# Patient Record
Sex: Female | Born: 1955 | Race: White | Hispanic: No | Marital: Married | Smoking: Never smoker
Health system: Southern US, Community
[De-identification: ages and names within clinical notes are randomized; demographics above are authoritative.]

## PROBLEM LIST (undated history)

## (undated) DIAGNOSIS — F32A Depression, unspecified: Secondary | ICD-10-CM

## (undated) DIAGNOSIS — R45851 Suicidal ideations: Secondary | ICD-10-CM

## (undated) DIAGNOSIS — G4700 Insomnia, unspecified: Secondary | ICD-10-CM

## (undated) DIAGNOSIS — F329 Major depressive disorder, single episode, unspecified: Secondary | ICD-10-CM

## (undated) DIAGNOSIS — F439 Reaction to severe stress, unspecified: Secondary | ICD-10-CM

## (undated) DIAGNOSIS — F39 Unspecified mood [affective] disorder: Secondary | ICD-10-CM

## (undated) HISTORY — DX: Reaction to severe stress, unspecified: F43.9

## (undated) HISTORY — DX: Major depressive disorder, single episode, unspecified: F32.9

## (undated) HISTORY — PX: ABDOMINAL HYSTERECTOMY: SHX81

## (undated) HISTORY — DX: Insomnia, unspecified: G47.00

## (undated) HISTORY — DX: Unspecified mood (affective) disorder: F39

## (undated) HISTORY — DX: Suicidal ideations: R45.851

## (undated) HISTORY — DX: Depression, unspecified: F32.A

---

## 1999-10-21 ENCOUNTER — Emergency Department (HOSPITAL_COMMUNITY): Admission: EM | Admit: 1999-10-21 | Discharge: 1999-10-21 | Payer: Self-pay | Admitting: Emergency Medicine

## 1999-10-21 ENCOUNTER — Encounter: Payer: Self-pay | Admitting: Emergency Medicine

## 1999-10-23 ENCOUNTER — Encounter: Payer: Self-pay | Admitting: General Surgery

## 1999-10-23 ENCOUNTER — Encounter (INDEPENDENT_AMBULATORY_CARE_PROVIDER_SITE_OTHER): Payer: Self-pay | Admitting: Specialist

## 1999-10-23 ENCOUNTER — Observation Stay (HOSPITAL_COMMUNITY): Admission: RE | Admit: 1999-10-23 | Discharge: 1999-10-24 | Payer: Self-pay | Admitting: General Surgery

## 2003-08-19 ENCOUNTER — Other Ambulatory Visit: Admission: RE | Admit: 2003-08-19 | Discharge: 2003-08-19 | Payer: Self-pay | Admitting: Internal Medicine

## 2005-01-07 ENCOUNTER — Ambulatory Visit: Payer: Self-pay | Admitting: Internal Medicine

## 2007-07-17 ENCOUNTER — Telehealth (INDEPENDENT_AMBULATORY_CARE_PROVIDER_SITE_OTHER): Payer: Self-pay | Admitting: *Deleted

## 2007-09-20 DIAGNOSIS — F329 Major depressive disorder, single episode, unspecified: Secondary | ICD-10-CM | POA: Insufficient documentation

## 2007-09-21 ENCOUNTER — Ambulatory Visit: Payer: Self-pay | Admitting: Internal Medicine

## 2008-05-15 ENCOUNTER — Telehealth: Payer: Self-pay | Admitting: Internal Medicine

## 2008-07-17 ENCOUNTER — Telehealth: Payer: Self-pay | Admitting: Internal Medicine

## 2008-09-04 ENCOUNTER — Telehealth: Payer: Self-pay | Admitting: Internal Medicine

## 2008-09-12 ENCOUNTER — Ambulatory Visit: Payer: Self-pay | Admitting: Internal Medicine

## 2008-09-12 DIAGNOSIS — E669 Obesity, unspecified: Secondary | ICD-10-CM

## 2008-09-12 LAB — CONVERTED CEMR LAB
Bilirubin Urine: NEGATIVE
Blood in Urine, dipstick: NEGATIVE
Glucose, Urine, Semiquant: NEGATIVE
Nitrite: NEGATIVE
Protein, U semiquant: NEGATIVE
Specific Gravity, Urine: 1.02
Urobilinogen, UA: 0.2
WBC Urine, dipstick: NEGATIVE
pH: 5.5

## 2008-09-13 LAB — CONVERTED CEMR LAB
ALT: 14 units/L (ref 0–35)
AST: 23 units/L (ref 0–37)
Albumin: 4.2 g/dL (ref 3.5–5.2)
Alkaline Phosphatase: 62 units/L (ref 39–117)
BUN: 17 mg/dL (ref 6–23)
Basophils Absolute: 0 10*3/uL (ref 0.0–0.1)
Basophils Relative: 0.4 % (ref 0.0–3.0)
Bilirubin, Direct: 0.1 mg/dL (ref 0.0–0.3)
CO2: 27 meq/L (ref 19–32)
Calcium: 9.9 mg/dL (ref 8.4–10.5)
Chloride: 106 meq/L (ref 96–112)
Cholesterol: 162 mg/dL (ref 0–200)
Creatinine, Ser: 1 mg/dL (ref 0.4–1.2)
Eosinophils Absolute: 0.2 10*3/uL (ref 0.0–0.7)
Eosinophils Relative: 2.6 % (ref 0.0–5.0)
GFR calc Af Amer: 75 mL/min
GFR calc non Af Amer: 62 mL/min
Glucose, Bld: 94 mg/dL (ref 70–99)
HCT: 42.7 % (ref 36.0–46.0)
HDL: 33.9 mg/dL — ABNORMAL LOW (ref >39.0)
Hemoglobin: 14.6 g/dL (ref 12.0–15.0)
LDL Cholesterol: 107 mg/dL — ABNORMAL HIGH (ref 0–99)
Lymphocytes Relative: 34.5 % (ref 12.0–46.0)
MCHC: 34.3 g/dL (ref 30.0–36.0)
MCV: 95.2 fL (ref 78.0–100.0)
Monocytes Absolute: 0.4 10*3/uL (ref 0.1–1.0)
Monocytes Relative: 6.8 % (ref 3.0–12.0)
Neutro Abs: 3.7 10*3/uL (ref 1.4–7.7)
Neutrophils Relative %: 55.7 % (ref 43.0–77.0)
Platelets: 195 10*3/uL (ref 150–400)
Potassium: 4.7 meq/L (ref 3.5–5.1)
RBC: 4.48 M/uL (ref 3.87–5.11)
RDW: 12.2 % (ref 11.5–14.6)
Sodium: 143 meq/L (ref 135–145)
TSH: 1.14 microintl units/mL (ref 0.35–5.50)
Total Bilirubin: 0.7 mg/dL (ref 0.3–1.2)
Total CHOL/HDL Ratio: 4.8
Total Protein: 7.2 g/dL (ref 6.0–8.3)
Triglycerides: 105 mg/dL (ref 0–149)
VLDL: 21 mg/dL (ref 0–40)
WBC: 6.5 10*3/uL (ref 4.5–10.5)

## 2008-10-17 ENCOUNTER — Telehealth (INDEPENDENT_AMBULATORY_CARE_PROVIDER_SITE_OTHER): Payer: Self-pay | Admitting: *Deleted

## 2008-10-18 ENCOUNTER — Ambulatory Visit: Payer: Self-pay | Admitting: Internal Medicine

## 2008-11-13 ENCOUNTER — Telehealth: Payer: Self-pay | Admitting: Internal Medicine

## 2009-05-27 LAB — CONVERTED CEMR LAB: Pap Smear: NORMAL

## 2009-09-29 ENCOUNTER — Ambulatory Visit: Payer: Self-pay | Admitting: Internal Medicine

## 2010-05-04 ENCOUNTER — Telehealth: Payer: Self-pay | Admitting: Internal Medicine

## 2010-07-01 ENCOUNTER — Ambulatory Visit: Payer: Self-pay | Admitting: Internal Medicine

## 2010-07-01 DIAGNOSIS — M549 Dorsalgia, unspecified: Secondary | ICD-10-CM | POA: Insufficient documentation

## 2010-07-02 LAB — CONVERTED CEMR LAB
ALT: 17 units/L (ref 0–35)
AST: 23 units/L (ref 0–37)
Albumin: 4.1 g/dL (ref 3.5–5.2)
Alkaline Phosphatase: 65 units/L (ref 39–117)
BUN: 16 mg/dL (ref 6–23)
Basophils Absolute: 0 10*3/uL (ref 0.0–0.1)
Basophils Relative: 0.4 % (ref 0.0–3.0)
Bilirubin, Direct: 0.2 mg/dL (ref 0.0–0.3)
CO2: 29 meq/L (ref 19–32)
Calcium: 9.4 mg/dL (ref 8.4–10.5)
Chloride: 110 meq/L (ref 96–112)
Cholesterol: 223 mg/dL — ABNORMAL HIGH (ref 0–200)
Creatinine, Ser: 0.9 mg/dL (ref 0.4–1.2)
Direct LDL: 123 mg/dL
Eosinophils Absolute: 0.2 10*3/uL (ref 0.0–0.7)
Eosinophils Relative: 3.5 % (ref 0.0–5.0)
GFR calc non Af Amer: 73.6 mL/min (ref >60)
Glucose, Bld: 101 mg/dL — ABNORMAL HIGH (ref 70–99)
HCT: 44.4 % (ref 36.0–46.0)
HDL: 50.1 mg/dL (ref >39.00)
Hemoglobin: 15.1 g/dL — ABNORMAL HIGH (ref 12.0–15.0)
Lymphocytes Relative: 31 % (ref 12.0–46.0)
Lymphs Abs: 2.1 10*3/uL (ref 0.7–4.0)
MCHC: 34 g/dL (ref 30.0–36.0)
MCV: 94.4 fL (ref 78.0–100.0)
Monocytes Absolute: 0.4 10*3/uL (ref 0.1–1.0)
Monocytes Relative: 5.2 % (ref 3.0–12.0)
Neutro Abs: 4.1 10*3/uL (ref 1.4–7.7)
Neutrophils Relative %: 59.9 % (ref 43.0–77.0)
Platelets: 173 10*3/uL (ref 150.0–400.0)
Potassium: 5.2 meq/L — ABNORMAL HIGH (ref 3.5–5.1)
RBC: 4.71 M/uL (ref 3.87–5.11)
RDW: 13.4 % (ref 11.5–14.6)
Sodium: 145 meq/L (ref 135–145)
TSH: 1.48 microintl units/mL (ref 0.35–5.50)
Total Bilirubin: 0.6 mg/dL (ref 0.3–1.2)
Total CHOL/HDL Ratio: 4
Total Protein: 7.1 g/dL (ref 6.0–8.3)
Triglycerides: 239 mg/dL — ABNORMAL HIGH (ref 0.0–149.0)
VLDL: 47.8 mg/dL — ABNORMAL HIGH (ref 0.0–40.0)
WBC: 6.8 10*3/uL (ref 4.5–10.5)

## 2010-10-27 ENCOUNTER — Ambulatory Visit: Payer: Self-pay | Admitting: Internal Medicine

## 2010-10-27 LAB — CONVERTED CEMR LAB
Bilirubin Urine: NEGATIVE
Glucose, Urine, Semiquant: NEGATIVE
Ketones, urine, test strip: NEGATIVE
Nitrite: NEGATIVE
Specific Gravity, Urine: 1.02
Urobilinogen, UA: 0.2
pH: 5.5

## 2011-01-16 ENCOUNTER — Encounter: Payer: Self-pay | Admitting: Internal Medicine

## 2011-01-26 NOTE — Assessment & Plan Note (Signed)
Summary: REVIEW MEDS, BACK ISSUES/ET   Vital Signs:  Patient profile:   55 year old female Height:      64 inches Weight:      218 pounds BMI:     37.55 Temp:     98.3 degrees F oral Pulse rate:   64 / minute Pulse rhythm:   regular Resp:     14 per minute BP sitting:   106 / 76  (left arm) Cuff size:   regular  Vitals Entered By: Gladis Riffle, RN (July 01, 2010 10:13 AM) CC: med review, also pulled back 06/29/10 working in yard, getting worse with spasms Is Patient Diabetic? No   CC:  med review, also pulled back 06/29/10 working in yard, and getting worse with spasms.  History of Present Illness: back pain she relates to working in yard duration: 2 days (admits to pain 3 weeks ago---acute onset bending over in shower) 2 days ago had gradual onset of back pain over 1-2 hours. became severe.  this a.m. pain was severe (wanted to go to hospital). Pain has eased off some no radiation of pain  All other systems reviewed and were negative   Preventive Screening-Counseling & Management  Alcohol-Tobacco     Smoking Status: never  Current Problems (verified): 1)  Obesity  (ICD-278.00) 2)  Preventive Health Care  (ICD-V70.0) 3)  Depression  (ICD-311)  Current Medications (verified): 1)  Alprazolam 0.5 Mg Tabs (Alprazolam) .... 3/4 Tablet By Mouth Every Night 2)  Prozac 20 Mg  Caps (Fluoxetine Hcl) .... One By Mouth Daily  Allergies: 1)  ! Pcn  Past History:  Past Medical History: Last updated: 10/18/2008 Depression Suicidal Thoughts (No Plans) Stress Insomnia Mood D/O   Past Surgical History: Last updated: 09/20/2007 Hysterectomy  Family History: Last updated: 09/29/2009 Family History of SLE Family History of Bowel disease Family History Kidney disease Family History of Prostate CA 1st degree relative <50 Family History of Respiratory disease Family History Lung cancer--father 37 yo  Social History: Last updated: 10/18/2008 Married Never Smoked Alcohol  use-no    Risk Factors: Smoking Status: never (07/01/2010)  Physical Exam  General:  alert and well-developed.   Head:  normocephalic and atraumatic.   Msk:  no back pain to palpation  Neurologic:  DTRs at knee and ankles normal and symmetric.    Impression & Recommendations:  Problem # 1:  BACK PAIN (ICD-724.5)  suspect muscle spasm trial muscle relaxer NSAID and as needed narcotic back exercises  side effects discussed  Her updated medication list for this problem includes:    Cyclobenzaprine Hcl 10 Mg Tabs (Cyclobenzaprine hcl) .Marland Kitchen... 1 by mouth 2 times daily as needed for back pain    Meloxicam 15 Mg Tabs (Meloxicam) ..... One by mouth daily with food    Hydrocodone-acetaminophen 5-325 Mg Tabs (Hydrocodone-acetaminophen) .Marland Kitchen... 1 by mouth up to 4 times per day as needed for pain  Complete Medication List: 1)  Alprazolam 0.5 Mg Tabs (Alprazolam) .... 3/4 tablet by mouth every night 2)  Prozac 20 Mg Caps (Fluoxetine hcl) .... One by mouth daily 3)  Cyclobenzaprine Hcl 10 Mg Tabs (Cyclobenzaprine hcl) .Marland Kitchen.. 1 by mouth 2 times daily as needed for back pain 4)  Meloxicam 15 Mg Tabs (Meloxicam) .... One by mouth daily with food 5)  Hydrocodone-acetaminophen 5-325 Mg Tabs (Hydrocodone-acetaminophen) .Marland Kitchen.. 1 by mouth up to 4 times per day as needed for pain  Other Orders: Venipuncture (66440) TLB-Lipid Panel (80061-LIPID) TLB-BMP (Basic Metabolic Panel-BMET) (80048-METABOL)  TLB-CBC Platelet - w/Differential (85025-CBCD) TLB-Hepatic/Liver Function Pnl (80076-HEPATIC) TLB-TSH (Thyroid Stimulating Hormone) (84443-TSH)  Patient Instructions: 1)  . Prescriptions: HYDROCODONE-ACETAMINOPHEN 5-325 MG TABS (HYDROCODONE-ACETAMINOPHEN) 1 by mouth up to 4 times per day as needed for pain  #30 x 0   Entered and Authorized by:   Birdie Sons MD   Signed by:   Birdie Sons MD on 07/01/2010   Method used:   Print then Give to Patient   RxID:   [id number] MELOXICAM 15 MG TABS  (MELOXICAM) one by mouth daily with food  #30 x 0   Entered and Authorized by:   Birdie Sons MD   Signed by:   Birdie Sons MD on 07/01/2010   Method used:   Electronically to        [facility] # [phone]* (retail)       [address] [place], [place]  [address]       Ph: [phone]       Fax: [phone]   RxID:   [id number] CYCLOBENZAPRINE HCL 10 MG  TABS (CYCLOBENZAPRINE HCL) 1 by mouth 2 times daily as needed for back pain  #20 x 1   Entered and Authorized by:   Birdie Sons MD   Signed by:   Birdie Sons MD on 07/01/2010   Method used:   Electronically to        [facility] [address] # [phone]* (retail)       [address] [place], [place]  [address]       Ph: [phone]       Fax: [phone]   RxID:   [id number]   Appended Document: REVIEW MEDS, BACK ISSUES/ET  Laboratory Results   Blood Tests     SED rate: 10 mm/hr  Comments: Rita Ohara  July 01, 2010 11:35 AM

## 2011-01-26 NOTE — Progress Notes (Signed)
Summary: request alprazolam  Phone Note Call from Patient Call back at [phone]   Caller: Patient--vm Call For: Birdie Sons MD Summary of Call: Lives parttime in [place] and parttime in [place].  Now in [place] and needs refill alprazolam #30 x 4 until can be seen here again in Sept.  [address] [place] [phone]. Initial call taken by: Gladis Riffle, RN,  May 04, 2010 2:41 PM  Follow-up for Phone Call        Husband is still In [facility] and has insurance here so unable to have MD in [place].  Will be back in Sept and make rov with you. Follow-up by: Gladis Riffle, RN,  May 04, 2010 2:42 PM  Additional Follow-up for Phone Call Additional follow up Details #1::        okay to refill Additional Follow-up by: Birdie Sons MD,  May 05, 2010 8:04 AM    Additional Follow-up for Phone Call Additional follow up Details #2::    see Rx.Patient notified. She is to make appt in sept. Follow-up by: Gladis Riffle, RN,  May 05, 2010 12:27 PM  Prescriptions: ALPRAZOLAM 0.5 MG TABS (ALPRAZOLAM) 3/4 tablet by mouth every night  #30 x 4   Entered by:   Gladis Riffle, RN   Authorized by:   Birdie Sons MD   Signed by:   Gladis Riffle, RN on 05/05/2010   Method used:   Telephoned to ...       [facility] [address] # [phone]* (retail)       [address] [place], [place]  [address]       Ph: [phone]       Fax: [phone]   RxID:   [id number]  called to [facility] [address] in [place], [place]  at [phone]

## 2011-03-16 ENCOUNTER — Other Ambulatory Visit: Payer: Self-pay | Admitting: Internal Medicine

## 2011-03-22 ENCOUNTER — Telehealth: Payer: Self-pay | Admitting: *Deleted

## 2011-03-22 MED ORDER — ALPRAZOLAM 0.5 MG PO TABS: 0.5000 mg | ORAL_TABLET | Freq: Every evening | ORAL | Status: DC | PRN

## 2011-03-22 NOTE — Telephone Encounter (Signed)
Pt made appt for fasting labs and OV, called in 30 day supply of xanax to [facility] in [place]

## 2011-04-19 ENCOUNTER — Other Ambulatory Visit: Payer: Self-pay | Admitting: *Deleted

## 2011-04-19 ENCOUNTER — Encounter: Payer: Self-pay | Admitting: Internal Medicine

## 2011-04-20 ENCOUNTER — Ambulatory Visit (INDEPENDENT_AMBULATORY_CARE_PROVIDER_SITE_OTHER)
Admission: RE | Admit: 2011-04-20 | Discharge: 2011-04-20 | Disposition: A | Discharge status: Home / Self Care | Payer: BC Managed Care – PPO | Source: Ambulatory Visit | Attending: Internal Medicine | Admitting: Internal Medicine

## 2011-04-20 ENCOUNTER — Ambulatory Visit (INDEPENDENT_AMBULATORY_CARE_PROVIDER_SITE_OTHER): Payer: BC Managed Care – PPO | Admitting: Internal Medicine

## 2011-04-20 ENCOUNTER — Encounter: Payer: Self-pay | Admitting: Internal Medicine

## 2011-04-20 VITALS — BP 112/84 | HR 76 | Temp 98.7°F | Ht 64.0 in | Wt 228.0 lb

## 2011-04-20 DIAGNOSIS — F329 Major depressive disorder, single episode, unspecified: Secondary | ICD-10-CM

## 2011-04-20 DIAGNOSIS — M7660 Achilles tendinitis, unspecified leg: Secondary | ICD-10-CM

## 2011-04-20 DIAGNOSIS — Z0000 Encounter for general adult medical examination without abnormal findings: Secondary | ICD-10-CM

## 2011-04-20 LAB — CBC WITH DIFFERENTIAL/PLATELET
Basophils Relative: 0.4 % (ref 0.0–3.0)
Eosinophils Relative: 6.9 % — ABNORMAL HIGH (ref 0.0–5.0)
HCT: 42.3 % (ref 36.0–46.0)
Hemoglobin: 14.4 g/dL (ref 12.0–15.0)
Lymphocytes Relative: 33.5 % (ref 12.0–46.0)
Monocytes Relative: 6.1 % (ref 3.0–12.0)
Neutro Abs: 3.8 10*3/uL (ref 1.4–7.7)
RBC: 4.51 Mil/uL (ref 3.87–5.11)

## 2011-04-20 LAB — LDL CHOLESTEROL, DIRECT: Direct LDL: 137.9 mg/dL

## 2011-04-20 LAB — BASIC METABOLIC PANEL
Calcium: 9.2 mg/dL (ref 8.4–10.5)
GFR: 69.6 mL/min (ref >60.00)
Potassium: 5.3 mEq/L — ABNORMAL HIGH (ref 3.5–5.1)
Sodium: 140 mEq/L (ref 135–145)

## 2011-04-20 LAB — LIPID PANEL
HDL: 49.2 mg/dL (ref >39.00)
Total CHOL/HDL Ratio: 4
VLDL: 33.8 mg/dL (ref 0.0–40.0)

## 2011-04-20 LAB — HEPATIC FUNCTION PANEL
Bilirubin, Direct: 0.1 mg/dL (ref 0.0–0.3)
Total Bilirubin: 0.7 mg/dL (ref 0.3–1.2)
Total Protein: 6.6 g/dL (ref 6.0–8.3)

## 2011-04-20 NOTE — Progress Notes (Signed)
  Subjective:    Patient ID: Shirley Allen, female    DOB: March 13, 1956, 55 y.o.   MRN: [id number]  HPI A lot of stress-she is splitting time between [place] and [place]  She has MSK complaints---right foot pain---knot on back of heel  Bilateral shoulder pain  She wants shingles vaccine---discussed  Weight---she admits to over eating. Also can't exercise  Past Medical History  Diagnosis Date  . Depression   . Suicidal thoughts   . Stress   . Insomnia   . Mood disorder    Past Surgical History  Procedure Date  . Abdominal hysterectomy     reports that she has never smoked. She does not have any smokeless tobacco history on file. She reports that she does not drink alcohol. Her drug history not on file. family history includes Aneurysm in her father; Arthritis in her brother, mother, and sister; Cancer in her father; Crohn's disease in her mother; Diabetes in her maternal grandmother, paternal grandfather, and paternal grandmother; Emphysema in her father; Irritable bowel syndrome in her father, paternal grandmother, and paternal uncle; Kidney disease in her father; and Lupus in her mother. Allergies  Allergen Reactions  . Penicillins       Review of Systems  patient denies chest pain, shortness of breath, orthopnea. Denies lower extremity edema, abdominal pain, change in appetite, change in bowel movements. Patient denies rashes, musculoskeletal complaints. No other specific complaints in a complete review of systems.      Objective:   Physical Exam  Well-developed well-nourished female in no acute distress. HEENT exam atraumatic, normocephalic, extraocular muscles are intact. Neck is supple. No jugular venous distention no thyromegaly. Chest clear to auscultation without increased work of breathing. Cardiac exam S1 and S2 are regular. Abdominal exam active bowel sounds, soft, nontender. Extremities no edema. Neurologic exam she is alert without any motor sensory deficits. Gait is  normal.        Assessment & Plan:  Well visit Discussed meds Advised regular exercise

## 2011-04-23 ENCOUNTER — Other Ambulatory Visit: Payer: Self-pay | Admitting: Internal Medicine

## 2011-04-23 DIAGNOSIS — M7660 Achilles tendinitis, unspecified leg: Secondary | ICD-10-CM

## 2011-04-25 NOTE — Assessment & Plan Note (Signed)
Discussed at length She is doing reasonably well Continue same meds

## 2011-04-26 ENCOUNTER — Telehealth: Payer: Self-pay | Admitting: *Deleted

## 2011-04-26 NOTE — Telephone Encounter (Signed)
Called pt and gave Dr Cato Mulligan recommendations on ankle xray and she called and l/m on voicemail that she was not going to do PT right now.  Wanted Dr Cato Mulligan to be aware that she changed her mind

## 2011-05-03 ENCOUNTER — Other Ambulatory Visit: Payer: Self-pay | Admitting: Internal Medicine

## 2011-06-15 ENCOUNTER — Ambulatory Visit: Payer: BC Managed Care – PPO | Admitting: Internal Medicine

## 2011-06-18 ENCOUNTER — Ambulatory Visit (INDEPENDENT_AMBULATORY_CARE_PROVIDER_SITE_OTHER): Payer: BC Managed Care – PPO | Admitting: Internal Medicine

## 2011-06-18 ENCOUNTER — Encounter: Payer: Self-pay | Admitting: Internal Medicine

## 2011-06-18 ENCOUNTER — Ambulatory Visit (INDEPENDENT_AMBULATORY_CARE_PROVIDER_SITE_OTHER)
Admission: RE | Admit: 2011-06-18 | Discharge: 2011-06-18 | Disposition: A | Discharge status: Home / Self Care | Payer: BC Managed Care – PPO | Source: Ambulatory Visit | Attending: Internal Medicine | Admitting: Internal Medicine

## 2011-06-18 VITALS — BP 116/80 | Temp 98.4°F | Ht 64.0 in | Wt 224.0 lb

## 2011-06-18 DIAGNOSIS — M719 Bursopathy, unspecified: Secondary | ICD-10-CM

## 2011-06-18 DIAGNOSIS — M7580 Other shoulder lesions, unspecified shoulder: Secondary | ICD-10-CM

## 2011-06-18 DIAGNOSIS — M67919 Unspecified disorder of synovium and tendon, unspecified shoulder: Secondary | ICD-10-CM

## 2011-06-18 NOTE — Assessment & Plan Note (Signed)
Needs to have further eval Check xray and send for PT

## 2011-06-18 NOTE — Progress Notes (Signed)
  Subjective:    Patient ID: Shirley Allen, female    DOB: May 01, 1956, 55 y.o.   MRN: [id number]  HPI l handed Progressive left arm pain for 6+ months. Now to the point that she cannot "do anything". Decreased ROM. Can't lift arm over head. She doesn't recall any recent trauma but many months ago she does recall a fall and she did catch most of her weight using her left arm. She denies any weakness. She denies any swelling of the shoulder.   Past Medical History  Diagnosis Date  . Depression   . Suicidal thoughts   . Stress   . Insomnia   . Mood disorder    Past Surgical History  Procedure Date  . Abdominal hysterectomy     reports that she has never smoked. She does not have any smokeless tobacco history on file. She reports that she does not drink alcohol. Her drug history not on file. family history includes Aneurysm in her father; Arthritis in her brother, mother, and sister; Cancer in her father; Crohn's disease in her mother; Diabetes in her maternal grandmother, paternal grandfather, and paternal grandmother; Emphysema in her father; Irritable bowel syndrome in her father, paternal grandmother, and paternal uncle; Kidney disease in her father; and Lupus in her mother. Allergies  Allergen Reactions  . Penicillins      Review of Systems    patient denies chest pain, shortness of breath, orthopnea. Denies lower extremity edema, abdominal pain, change in appetite, change in bowel movements. Patient denies rashes, musculoskeletal complaints. No other specific complaints in a complete review of systems.    Objective:   Physical Exam Well-developed, overweight female in no acute distress. Neck is supple. Chest clear to auscultation cardiac exam S1 and S2 are regular. She has full range of motion of her right shoulder. She is significantly decreased abduction of her left shoulder. She has significantly decreased range of motion with pain to internal and external rotation.        Assessment & Plan:  Patient has a rotator cuff injury. I think she should see an orthopedist. I suspect she will need surgery. She would like to pursue other avenues at this time. I will refer her to physical therapy to start with. She will call me at the end of physical therapy. I suspect she will need orthopedic intervention.

## 2011-06-22 ENCOUNTER — Telehealth: Payer: Self-pay | Admitting: *Deleted

## 2011-06-22 NOTE — Telephone Encounter (Signed)
Pt aware.

## 2011-06-22 NOTE — Telephone Encounter (Signed)
no

## 2011-06-22 NOTE — Telephone Encounter (Signed)
Pt wants to know if Dr Cato Mulligan wants an MRI before she start PT

## 2011-06-29 ENCOUNTER — Ambulatory Visit: Payer: BC Managed Care – PPO | Attending: Internal Medicine | Admitting: Physical Therapy

## 2011-06-29 DIAGNOSIS — M25519 Pain in unspecified shoulder: Secondary | ICD-10-CM | POA: Insufficient documentation

## 2011-06-29 DIAGNOSIS — M25619 Stiffness of unspecified shoulder, not elsewhere classified: Secondary | ICD-10-CM | POA: Insufficient documentation

## 2011-06-29 DIAGNOSIS — IMO0001 Reserved for inherently not codable concepts without codable children: Secondary | ICD-10-CM | POA: Insufficient documentation

## 2011-06-29 DIAGNOSIS — R5381 Other malaise: Secondary | ICD-10-CM | POA: Insufficient documentation

## 2011-07-06 ENCOUNTER — Ambulatory Visit: Payer: BC Managed Care – PPO | Admitting: Physical Therapy

## 2011-07-08 ENCOUNTER — Ambulatory Visit: Payer: BC Managed Care – PPO | Admitting: Physical Therapy

## 2011-07-13 ENCOUNTER — Ambulatory Visit: Payer: BC Managed Care – PPO | Admitting: Rehabilitation

## 2011-07-15 ENCOUNTER — Ambulatory Visit: Payer: BC Managed Care – PPO | Admitting: Rehabilitation

## 2011-07-20 ENCOUNTER — Ambulatory Visit: Payer: BC Managed Care – PPO | Admitting: Rehabilitation

## 2011-07-22 ENCOUNTER — Ambulatory Visit: Payer: BC Managed Care – PPO | Admitting: Rehabilitation

## 2011-07-27 ENCOUNTER — Ambulatory Visit: Payer: BC Managed Care – PPO | Admitting: Rehabilitation

## 2011-07-29 ENCOUNTER — Ambulatory Visit: Payer: BC Managed Care – PPO | Admitting: Rehabilitation

## 2011-08-03 ENCOUNTER — Encounter: Payer: BC Managed Care – PPO | Admitting: Rehabilitation

## 2011-08-05 ENCOUNTER — Encounter: Payer: BC Managed Care – PPO | Admitting: Rehabilitation

## 2011-08-13 ENCOUNTER — Encounter: Payer: Self-pay | Admitting: Family Medicine

## 2011-08-13 ENCOUNTER — Ambulatory Visit (INDEPENDENT_AMBULATORY_CARE_PROVIDER_SITE_OTHER): Payer: BC Managed Care – PPO | Admitting: Family Medicine

## 2011-08-13 VITALS — BP 120/88 | HR 90 | Ht 64.0 in | Wt 228.0 lb

## 2011-08-13 DIAGNOSIS — M25519 Pain in unspecified shoulder: Secondary | ICD-10-CM

## 2011-08-13 DIAGNOSIS — M25512 Pain in left shoulder: Secondary | ICD-10-CM

## 2011-08-13 NOTE — Progress Notes (Signed)
Subjective:    Patient ID: Shirley Allen, female    DOB: September 02, 1956, 55 y.o.   MRN: [id number]  PCP: Dr. Birdie Sons  HPI 49 yo F here with left shoulder pain.  Patient denies known injury. Pain started in left shoulder when she did a lot of yardwork, hedge trimming with reaching and overhead motions. This started about a year ago. Pain laterally in left shoulder and stays in upper arm. No numbness or tingling. No neck pain though is getting more pain in lateral trapezius. Pain worse with overhead and reaching activities, now has decreased motion. Has started PT for 7 visits but advised to follow-up here for further evaluation. No prior problems or surgeries with left shoulder. Is icing and motrin is slightly helpful. + night pain.  Past Medical History  Diagnosis Date  . Depression   . Suicidal thoughts   . Stress   . Insomnia   . Mood disorder     Current Outpatient Prescriptions on File Prior to Visit  Medication Sig Dispense Refill  . ALPRAZolam (XANAX) 0.5 MG tablet TAKE 1 TABLET BY MOUTH EVERY NIGHT AT BEDTIME AS NEEDED  30 tablet  5  . FLUoxetine (PROZAC) 20 MG tablet Take 20 mg by mouth daily.        Marland Kitchen omeprazole (PRILOSEC) 20 MG capsule Take 20 mg by mouth daily.          Past Surgical History  Procedure Date  . Abdominal hysterectomy     Allergies  Allergen Reactions  . Penicillins     History   Social History  . Marital Status: Married    Spouse Name: N/A    Number of Children: N/A  . Years of Education: N/A   Occupational History  . Not on file.   Social History Main Topics  . Smoking status: Never Smoker   . Smokeless tobacco: Never Used  . Alcohol Use: No  . Drug Use: Not on file  . Sexually Active: Not on file   Other Topics Concern  . Not on file   Social History Narrative  . No narrative on file    Family History  Problem Relation Age of Onset  . Cancer Father     lung and prostate  . Kidney disease Father     stone  .  Emphysema Father   . Aneurysm Father     abd  . Irritable bowel syndrome Father   . Diabetes Father   . Heart attack Father   . Hyperlipidemia Father   . Hypertension Father   . Lupus Mother   . Arthritis Mother   . Crohn's disease Mother   . Heart attack Mother   . Hyperlipidemia Mother   . Hypertension Mother   . Arthritis Sister   . Arthritis Brother   . Irritable bowel syndrome Paternal Uncle   . Diabetes Maternal Grandmother   . Heart attack Maternal Grandmother   . Diabetes Paternal Grandmother   . Irritable bowel syndrome Paternal Grandmother   . Diabetes Paternal Grandfather   . Diabetes Maternal Grandfather     BP 120/88  Pulse 90  Ht 5\' 4"  (1.626 m)  Wt 228 lb (103.42 kg)  BMI 39.14 kg/m2  Review of Systems See HPI above.    Objective:   Physical Exam Gen: NAD L shoulder: No swelling, ecchymoses.  No gross deformity. No biceps tendon or AC TTP. ER to 45 degrees, abduction 100 degrees, flexion 110 degrees compared to FROM R shoulder. +  hawkins and neers Negative Speeds, [facility]. + empty can but 5/5 strength.  5/5 strength with resisted internal/external rotation. NV intact distally.  MSK u/s L shoulder: biceps tendon intact on long and transverse views without target sign.  AC joint with mild arthropathy.  Subscapularis appears normal and intact without impingement under coracoid.  Supraspinatus appears intact on long and transverse views without retraction, no impingement on abduction under acromion.    Assessment & Plan:  1. Left shoulder pain - 2/2 adhesive capsulitis.  Patient's pain likely started with rotator cuff tendinopathy and impingement and progressed to adhesive capsulitis based on her history and exam.  Stop PT and focus on codman exercises for ROM.  Combination intraarticular/subacromial injection given.  Heat 15 minutes at a time 3-4 times a day.  See instructions for further.

## 2011-08-13 NOTE — Patient Instructions (Signed)
You have a frozen shoulder (adhesive capsulitis), a buildup of scar tissue that limits motion of the shoulder joint. Your ultrasound does not show evidence of a full thickness rotator cuff tear that would necessitate surgery.  Your history is also not typical for a rotator cuff tear. Limit lifting and overhead activities as much as possible. Heat 15 minutes at a time 3-4 times a day may help with movement and stiffness. Tylenol and/or aleve as needed for pain and inflammation. Steroid injections in a series have been shown to help with pain and motion. Codman exercises (pendulum, wall walking or table slides, arm circles) - do 3 sets of 15 daily of each of these. Physical therapy for rotator cuff strengthening is a consideration once you are out of the painful phase. Follow up in 6 weeks - we will repeat the ultrasound.

## 2011-08-14 ENCOUNTER — Encounter: Payer: Self-pay | Admitting: Family Medicine

## 2011-08-14 DIAGNOSIS — M25512 Pain in left shoulder: Secondary | ICD-10-CM | POA: Insufficient documentation

## 2011-08-14 NOTE — Assessment & Plan Note (Signed)
2/2 adhesive capsulitis.  Patient's pain likely started with rotator cuff tendinopathy and impingement and progressed to adhesive capsulitis based on her history and exam.  Stop PT and focus on codman exercises for ROM.  Combination intraarticular/subacromial injection given.  Heat 15 minutes at a time 3-4 times a day.  See instructions for further.  Timeout performed.  After informed written consent, patient was seated on exam table. Left shoulder was prepped with alcohol swab and utilizing posterior approach, patient's left shoulder was injected with 6:2 marcaine:depomedrol with half in the subacromial space and half in glenohumeral space.  Patient tolerated the procedure well without immediate complications.

## 2011-09-24 ENCOUNTER — Ambulatory Visit (INDEPENDENT_AMBULATORY_CARE_PROVIDER_SITE_OTHER): Payer: BC Managed Care – PPO | Admitting: Family Medicine

## 2011-09-24 ENCOUNTER — Encounter: Payer: Self-pay | Admitting: Family Medicine

## 2011-09-24 VITALS — BP 110/76 | HR 82 | Temp 97.9°F | Ht 64.0 in | Wt 228.0 lb

## 2011-09-24 DIAGNOSIS — M25512 Pain in left shoulder: Secondary | ICD-10-CM

## 2011-09-24 DIAGNOSIS — M25519 Pain in unspecified shoulder: Secondary | ICD-10-CM

## 2011-09-24 NOTE — Progress Notes (Signed)
Subjective:    Patient ID: Shirley Allen, female    DOB: 02/21/1956, 55 y.o.   MRN: [id number]  PCP: Dr. Birdie Sons  HPI  55 yo F here for f/u left shoulder pain.  8/17: Patient denies known injury. Pain started in left shoulder when she did a lot of yardwork, hedge trimming with reaching and overhead motions. This started about a year ago. Pain laterally in left shoulder and stays in upper arm. No numbness or tingling. No neck pain though is getting more pain in lateral trapezius. Pain worse with overhead and reaching activities, now has decreased motion. Has started PT for 7 visits but advised to follow-up here for further evaluation. No prior problems or surgeries with left shoulder. Is icing and motrin is slightly helpful. + night pain.  9/28: Patient reports the injection helped significantly for about 4 weeks. She has been doing home exercises as directed Using heat Taking motrin as needed. Shoulder still feels stiff. + night pain  Past Medical History  Diagnosis Date  . Depression   . Suicidal thoughts   . Stress   . Insomnia   . Mood disorder     Current Outpatient Prescriptions on File Prior to Visit  Medication Sig Dispense Refill  . ALPRAZolam (XANAX) 0.5 MG tablet TAKE 1 TABLET BY MOUTH EVERY NIGHT AT BEDTIME AS NEEDED  30 tablet  5  . FLUoxetine (PROZAC) 20 MG tablet Take 20 mg by mouth daily.        Marland Kitchen omeprazole (PRILOSEC) 20 MG capsule Take 20 mg by mouth daily.          Past Surgical History  Procedure Date  . Abdominal hysterectomy     Allergies  Allergen Reactions  . Penicillins     History   Social History  . Marital Status: Married    Spouse Name: N/A    Number of Children: N/A  . Years of Education: N/A   Occupational History  . Not on file.   Social History Main Topics  . Smoking status: Never Smoker   . Smokeless tobacco: Never Used  . Alcohol Use: No  . Drug Use: Not on file  . Sexually Active: Not on file   Other  Topics Concern  . Not on file   Social History Narrative  . No narrative on file    Family History  Problem Relation Age of Onset  . Cancer Father     lung and prostate  . Kidney disease Father     stone  . Emphysema Father   . Aneurysm Father     abd  . Irritable bowel syndrome Father   . Diabetes Father   . Heart attack Father   . Hyperlipidemia Father   . Hypertension Father   . Lupus Mother   . Arthritis Mother   . Crohn's disease Mother   . Heart attack Mother   . Hyperlipidemia Mother   . Hypertension Mother   . Arthritis Sister   . Arthritis Brother   . Irritable bowel syndrome Paternal Uncle   . Diabetes Maternal Grandmother   . Heart attack Maternal Grandmother   . Diabetes Paternal Grandmother   . Irritable bowel syndrome Paternal Grandmother   . Diabetes Paternal Grandfather   . Diabetes Maternal Grandfather     BP 110/76  Pulse 82  Temp(Src) 97.9 F (36.6 C) (Oral)  Ht 5\' 4"  (1.626 m)  Wt 228 lb (103.42 kg)  BMI 39.14 kg/m2  Review of Systems  See HPI above.    Objective:   Physical Exam  Gen: NAD L shoulder: No swelling, ecchymoses.  No gross deformity. No biceps tendon or AC TTP. ER to 30 degrees, abduction 90 degrees, flexion 100 degrees compared to FROM R shoulder. + hawkins and neers Negative Speeds, [employer]. + empty can with 4+/5 strength.  5/5 strength with resisted internal/external rotation. NV intact distally.  MSK u/s L shoulder: biceps tendon intact on long and transverse views without target sign. Subscapularis appears normal and intact.  Supraspinatus appears intact on long and transverse views without retraction.    Assessment & Plan:  1. Left shoulder pain - 2/2 adhesive capsulitis.  Repeat intraarticular injection today.  Continue codman exercises.  Heat, motrin as needed.  See instructions.    After informed written consent, patient was seated on exam table. Left shoulder was prepped with alcohol swab and utilizing  posterior approach, patient's left glenohumeral space was injected with 4:1 marcaine: depomedrol. Patient tolerated the procedure well without immediate complications.

## 2011-09-24 NOTE — Assessment & Plan Note (Signed)
2/2 adhesive capsulitis. Repeat intraarticular injection today. Continue codman exercises. Heat, motrin as needed. See instructions.   After informed written consent, patient was seated on exam table. Left shoulder was prepped with alcohol swab and utilizing posterior approach, patient's left glenohumeral space was injected with 4:1 marcaine: depomedrol. Patient tolerated the procedure well without immediate complications.

## 2011-09-24 NOTE — Patient Instructions (Addendum)
You have a frozen shoulder (adhesive capsulitis), a buildup of scar tissue that limits motion of the shoulder joint. Your ultrasound again does not show evidence of a full thickness rotator cuff tear that would necessitate surgery which is great! Limit lifting and overhead activities as much as possible. Heat 15 minutes at a time 3-4 times a day may help with movement and stiffness. Tylenol and/or aleve as needed for pain and inflammation. Steroid injections in a series have been shown to help with pain and motion - we may repeat last one in 6 weeks depending on your pain and motion. Codman exercises (pendulum, wall walking or table slides, arm circles) - do 3 sets of 15 daily of each of these - all at the same time. Physical therapy for rotator cuff strengthening is a consideration once you are out of the painful phase. Follow up in 6 weeks - we will repeat the ultrasound.  Achilles Tendinopathy Tylenol or aleve as needed for pain Lowering/raise on a step exercises 3 x 15 once or twice a day - two feet first then one (start with 3 x 6) Can add heel walks, toe walks forward and backward as well Ice bucket 10-15 minutes at end of day - can ice 3-4 times a day. Avoid uneven ground, hills, barefoot walking as much as possible. Heel lifts during the day when not doing exercises to unload achilles. Custom orthotics may be helpful Physical therapy, nitro patches are considerations if you aren't improving as expected.

## 2011-11-04 ENCOUNTER — Ambulatory Visit: Payer: BC Managed Care – PPO | Admitting: Family Medicine

## 2011-11-05 ENCOUNTER — Ambulatory Visit: Payer: BC Managed Care – PPO | Admitting: Family Medicine

## 2011-11-11 ENCOUNTER — Ambulatory Visit (INDEPENDENT_AMBULATORY_CARE_PROVIDER_SITE_OTHER): Payer: BC Managed Care – PPO | Admitting: Family Medicine

## 2011-11-11 ENCOUNTER — Encounter: Payer: Self-pay | Admitting: Family Medicine

## 2011-11-11 VITALS — BP 98/67 | HR 75 | Temp 97.4°F | Ht 64.0 in | Wt 226.0 lb

## 2011-11-11 DIAGNOSIS — S39012A Strain of muscle, fascia and tendon of lower back, initial encounter: Secondary | ICD-10-CM

## 2011-11-11 DIAGNOSIS — S335XXA Sprain of ligaments of lumbar spine, initial encounter: Secondary | ICD-10-CM

## 2011-11-11 DIAGNOSIS — M549 Dorsalgia, unspecified: Secondary | ICD-10-CM

## 2011-11-11 MED ORDER — HYDROCODONE-ACETAMINOPHEN 5-325 MG PO TABS: 1.0000 | ORAL_TABLET | Freq: Four times a day (QID) | ORAL | Status: DC | PRN

## 2011-11-11 MED ORDER — CYCLOBENZAPRINE HCL 10 MG PO TABS: 10.0000 mg | ORAL_TABLET | Freq: Three times a day (TID) | ORAL | Status: DC | PRN

## 2011-11-11 MED ORDER — MELOXICAM 15 MG PO TABS: 15.0000 mg | ORAL_TABLET | Freq: Every day | ORAL | Status: DC

## 2011-11-11 NOTE — Assessment & Plan Note (Signed)
no evidence radiculopathy.  Will start meloxicam daily with food, flexeril + vicodin as needed for muscle spasms and pain.  Handout provided and demonstrated a home exercise program.  Heat as needed for spasms.  Offered PT but she would like to do home program and will consider this if she is not improving.  See instructions for further.  F/u in 3-4 weeks.

## 2011-11-11 NOTE — Progress Notes (Signed)
Subjective:    Patient ID: Shirley Allen, female    DOB: 02/16/1956, 55 y.o.   MRN: [id number]  PCP: Dr. Cato Mulligan  HPI 55 yo F here for low back pain.  Patient reports that over the past week low back has been bothering her without an acute injury. Then states on 11/11 she did a lot of yardwork and cleaning around the house - pain worsened and has been severe since Monday morning, 11/12. Mostly in right side of low back. No radiation into legs. No numbness or tingling. No bowel/bladder dysfunction. Took some motrin but nothing today. Not icing or using heat. Pain worse with movement, better with rest. Has never had an MRI or ESIs of back.  Past Medical History  Diagnosis Date  . Depression   . Suicidal thoughts   . Stress   . Insomnia   . Mood disorder     Current Outpatient Prescriptions on File Prior to Visit  Medication Sig Dispense Refill  . ALPRAZolam (XANAX) 0.5 MG tablet TAKE 1 TABLET BY MOUTH EVERY NIGHT AT BEDTIME AS NEEDED  30 tablet  5  . FLUoxetine (PROZAC) 20 MG tablet Take 20 mg by mouth daily.        Marland Kitchen omeprazole (PRILOSEC) 20 MG capsule Take 20 mg by mouth daily.          Past Surgical History  Procedure Date  . Abdominal hysterectomy     Allergies  Allergen Reactions  . Penicillins     History   Social History  . Marital Status: Married    Spouse Name: N/A    Number of Children: N/A  . Years of Education: N/A   Occupational History  . Not on file.   Social History Main Topics  . Smoking status: Never Smoker   . Smokeless tobacco: Never Used  . Alcohol Use: No  . Drug Use: Not on file  . Sexually Active: Not on file   Other Topics Concern  . Not on file   Social History Narrative  . No narrative on file    Family History  Problem Relation Age of Onset  . Cancer Father     lung and prostate  . Kidney disease Father     stone  . Emphysema Father   . Aneurysm Father     abd  . Irritable bowel syndrome Father   . Diabetes  Father   . Heart attack Father   . Hyperlipidemia Father   . Hypertension Father   . Lupus Mother   . Arthritis Mother   . Crohn's disease Mother   . Heart attack Mother   . Hyperlipidemia Mother   . Hypertension Mother   . Arthritis Sister   . Arthritis Brother   . Irritable bowel syndrome Paternal Uncle   . Diabetes Maternal Grandmother   . Heart attack Maternal Grandmother   . Diabetes Paternal Grandmother   . Irritable bowel syndrome Paternal Grandmother   . Diabetes Paternal Grandfather   . Diabetes Maternal Grandfather     BP 98/67  Pulse 75  Temp(Src) 97.4 F (36.3 C) (Oral)  Ht 5\' 4"  (1.626 m)  Wt 226 lb (102.513 kg)  BMI 38.79 kg/m2  Review of Systems See HPI above.    Objective:   Physical Exam Gen: NAD  Back: No gross deformity, scoliosis.  Accentuated lordosis of low back. TTP R > L paraspinal muscles lumbar spine.  No focal bony TTP. FROM with pain on extension and full flexion -  about equal. Strength LEs 5/5 all muscle groups.   2+ MSRs in patellar and achilles tendons, equal bilaterally. Negative SLRs. Sensation intact to light touch bilaterally. Negative logroll bilateral hips Negative fabers and piriformis stretches.    Assessment & Plan:  1. Low back pain - no evidence radiculopathy.  Will start meloxicam daily with food, flexeril + vicodin as needed for muscle spasms and pain.  Handout provided and demonstrated a home exercise program.  Heat as needed for spasms.  Offered PT but she would like to do home program and will consider this if she is not improving.  See instructions for further.  F/u in 3-4 weeks.

## 2011-11-11 NOTE — Patient Instructions (Signed)
You have lumbar spasms/strain Meloxicam daily with food for pain and inflammation x 7 days then as needed. Vicodin as needed for severe pain (no driving on this medicine). Flexeril as needed for muscle spasms (no driving on this medicine if it makes you sleepy). Stay as active as possible. Do home exercises and stretches as directed - hold each for 20-30 seconds and do each one three times once a day - pick about 3-6 of the ones in the handout that work the best for you and do them once a day. Consider massage, chiropractor, physical therapy, and/or acupuncture. Physical therapy has been shown to be helpful while the others have mixed results. If you want to go ahead with physical therapy (if you're struggling over the next 1-2 weeks), just call me and I'll order it for you. Strengthening of low back muscles, abdominal musculature are key for long term pain relief. If not improving, will consider further imaging (x-rays, MRI) and/or other medications (neurontin, lyrica, nortriptyline) that help with pain. Follow up with me in 3-4 weeks otherwise.

## 2011-11-23 ENCOUNTER — Other Ambulatory Visit: Payer: Self-pay | Admitting: Internal Medicine

## 2011-12-09 ENCOUNTER — Ambulatory Visit: Payer: BC Managed Care – PPO | Admitting: Family Medicine

## 2012-01-20 ENCOUNTER — Other Ambulatory Visit: Payer: Self-pay | Admitting: Family Medicine

## 2012-01-24 ENCOUNTER — Other Ambulatory Visit: Payer: Self-pay | Admitting: *Deleted

## 2012-01-24 MED ORDER — FLUOXETINE HCL 20 MG PO TABS: 20.0000 mg | ORAL_TABLET | Freq: Every day | ORAL | Status: DC

## 2012-02-22 ENCOUNTER — Other Ambulatory Visit: Payer: Self-pay | Admitting: Internal Medicine

## 2012-05-08 ENCOUNTER — Other Ambulatory Visit: Payer: Self-pay | Admitting: Internal Medicine

## 2012-06-18 ENCOUNTER — Other Ambulatory Visit: Payer: Self-pay | Admitting: Family Medicine

## 2012-06-19 ENCOUNTER — Other Ambulatory Visit: Payer: Self-pay | Admitting: *Deleted

## 2012-06-19 MED ORDER — ALPRAZOLAM 0.5 MG PO TABS: 0.5000 mg | ORAL_TABLET | Freq: Every evening | ORAL | Status: DC | PRN

## 2012-07-13 ENCOUNTER — Ambulatory Visit: Payer: BC Managed Care – PPO | Admitting: Internal Medicine

## 2012-07-18 ENCOUNTER — Other Ambulatory Visit: Payer: Self-pay | Admitting: Internal Medicine

## 2012-07-19 NOTE — Telephone Encounter (Signed)
Ok x one

## 2012-07-21 ENCOUNTER — Encounter: Payer: Self-pay | Admitting: Internal Medicine

## 2012-07-21 ENCOUNTER — Ambulatory Visit (INDEPENDENT_AMBULATORY_CARE_PROVIDER_SITE_OTHER): Payer: BC Managed Care – PPO | Admitting: Internal Medicine

## 2012-07-21 VITALS — BP 100/74 | Temp 97.7°F | Wt 231.0 lb

## 2012-07-21 DIAGNOSIS — I8390 Asymptomatic varicose veins of unspecified lower extremity: Secondary | ICD-10-CM

## 2012-07-21 DIAGNOSIS — F329 Major depressive disorder, single episode, unspecified: Secondary | ICD-10-CM

## 2012-07-21 DIAGNOSIS — G4710 Hypersomnia, unspecified: Secondary | ICD-10-CM

## 2012-07-21 DIAGNOSIS — E785 Hyperlipidemia, unspecified: Secondary | ICD-10-CM

## 2012-07-21 DIAGNOSIS — F3289 Other specified depressive episodes: Secondary | ICD-10-CM

## 2012-07-21 DIAGNOSIS — K219 Gastro-esophageal reflux disease without esophagitis: Secondary | ICD-10-CM

## 2012-07-21 LAB — CBC WITH DIFFERENTIAL/PLATELET
Basophils Absolute: 0 10*3/uL (ref 0.0–0.1)
Basophils Relative: 0.2 % (ref 0.0–3.0)
Eosinophils Absolute: 0.3 10*3/uL (ref 0.0–0.7)
Lymphocytes Relative: 35.2 % (ref 12.0–46.0)
MCHC: 33.1 g/dL (ref 30.0–36.0)
Neutrophils Relative %: 53.8 % (ref 43.0–77.0)
RBC: 4.38 Mil/uL (ref 3.87–5.11)
RDW: 13.7 % (ref 11.5–14.6)

## 2012-07-21 LAB — LIPID PANEL
HDL: 48.3 mg/dL (ref >39.00)
Triglycerides: 139 mg/dL (ref 0.0–149.0)

## 2012-07-21 NOTE — Progress Notes (Signed)
Patient ID: Shirley Allen, female   DOB: May 17, 1956, 56 y.o.   MRN: [id number] Mood-- doing well on prozac  gerd-- no sxs on PPI  Sleep-- uses xanax nightly and has for years  Varicose Veins-- painful and throbbing even with compression stockings.   Past Medical History  Diagnosis Date  . Depression   . Suicidal thoughts   . Stress   . Insomnia   . Mood disorder     History   Social History  . Marital Status: Married    Spouse Name: N/A    Number of Children: N/A  . Years of Education: N/A   Occupational History  . Not on file.   Social History Main Topics  . Smoking status: Never Smoker   . Smokeless tobacco: Never Used  . Alcohol Use: No  . Drug Use: Not on file  . Sexually Active: Not on file   Other Topics Concern  . Not on file   Social History Narrative  . No narrative on file    Past Surgical History  Procedure Date  . Abdominal hysterectomy     Family History  Problem Relation Age of Onset  . Cancer Father     lung and prostate  . Kidney disease Father     stone  . Emphysema Father   . Aneurysm Father     abd  . Irritable bowel syndrome Father   . Diabetes Father   . Heart attack Father   . Hyperlipidemia Father   . Hypertension Father   . Lupus Mother   . Arthritis Mother   . Crohn's disease Mother   . Heart attack Mother   . Hyperlipidemia Mother   . Hypertension Mother   . Arthritis Sister   . Arthritis Brother   . Irritable bowel syndrome Paternal Uncle   . Diabetes Maternal Grandmother   . Heart attack Maternal Grandmother   . Diabetes Paternal Grandmother   . Irritable bowel syndrome Paternal Grandmother   . Diabetes Paternal Grandfather   . Diabetes Maternal Grandfather     Allergies  Allergen Reactions  . Penicillins     Current Outpatient Prescriptions on File Prior to Visit  Medication Sig Dispense Refill  . ALPRAZolam (XANAX) 0.5 MG tablet TAKE 1 TABLET BY MOUTH EVERY NIGHT AT BEDTIME AS NEEDED FOR SLEEP  30 tablet   0  . FLUoxetine (PROZAC) 20 MG capsule TAKE ONE CAPSULE BY MOUTH DAILY  90 capsule  3  . omeprazole (PRILOSEC) 20 MG capsule Take 20 mg by mouth daily.        Marland Kitchen DISCONTD: FLUoxetine (PROZAC) 20 MG tablet Take 1 tablet (20 mg total) by mouth daily.  90 tablet  1     patient denies chest pain, shortness of breath, orthopnea. Denies lower extremity edema, abdominal pain, change in appetite, change in bowel movements. Patient denies rashes, musculoskeletal complaints. No other specific complaints in a complete review of systems.   BP 100/74  Temp 97.7 F (36.5 C) (Oral)  Wt 231 lb (104.781 kg)  Well-developed well-nourished female in no acute distress. HEENT exam atraumatic, normocephalic, extraocular muscles are intact. Neck is supple. No jugular venous distention no thyromegaly. Chest clear to auscultation without increased work of breathing. Cardiac exam S1 and S2 are regular. Abdominal exam active bowel sounds, soft, nontender. Extremities no edema.significant varicose veins.

## 2012-07-26 NOTE — Assessment & Plan Note (Signed)
No sxs Continue ppi 

## 2012-07-26 NOTE — Assessment & Plan Note (Signed)
Large, painful varicose veins bilaterally-- refer vascular surgery

## 2012-07-26 NOTE — Assessment & Plan Note (Signed)
Doing well on meds Continue same meds

## 2012-08-14 ENCOUNTER — Other Ambulatory Visit: Payer: Self-pay | Admitting: Internal Medicine

## 2012-08-14 DIAGNOSIS — G4710 Hypersomnia, unspecified: Secondary | ICD-10-CM

## 2012-08-17 ENCOUNTER — Other Ambulatory Visit: Payer: Self-pay | Admitting: Internal Medicine

## 2012-08-20 ENCOUNTER — Other Ambulatory Visit: Payer: Self-pay | Admitting: Internal Medicine

## 2012-08-23 ENCOUNTER — Other Ambulatory Visit: Payer: Self-pay

## 2012-08-23 DIAGNOSIS — I83893 Varicose veins of bilateral lower extremities with other complications: Secondary | ICD-10-CM

## 2012-08-29 ENCOUNTER — Encounter: Payer: Self-pay | Admitting: Vascular Surgery

## 2012-08-30 ENCOUNTER — Encounter: Payer: BC Managed Care – PPO | Admitting: Vascular Surgery

## 2012-09-06 ENCOUNTER — Institutional Professional Consult (permissible substitution): Payer: BC Managed Care – PPO | Admitting: Pulmonary Disease

## 2012-10-11 ENCOUNTER — Encounter: Payer: BC Managed Care – PPO | Admitting: Vascular Surgery

## 2012-12-17 IMAGING — CR DG ANKLE COMPLETE 3+V*R*
3 series · 3 of 3 positions shown · non-contrast
Comparison: None.

CLINICAL DATA: Bilateral posterior ankle pain.  Palpable
abnormality on right posterior ankle.  Pain for 2 weeks.  Question
bone spurs.

RIGHT ANKLE - COMPLETE 3+ VIEW

[view not recorded (1 of 3)]
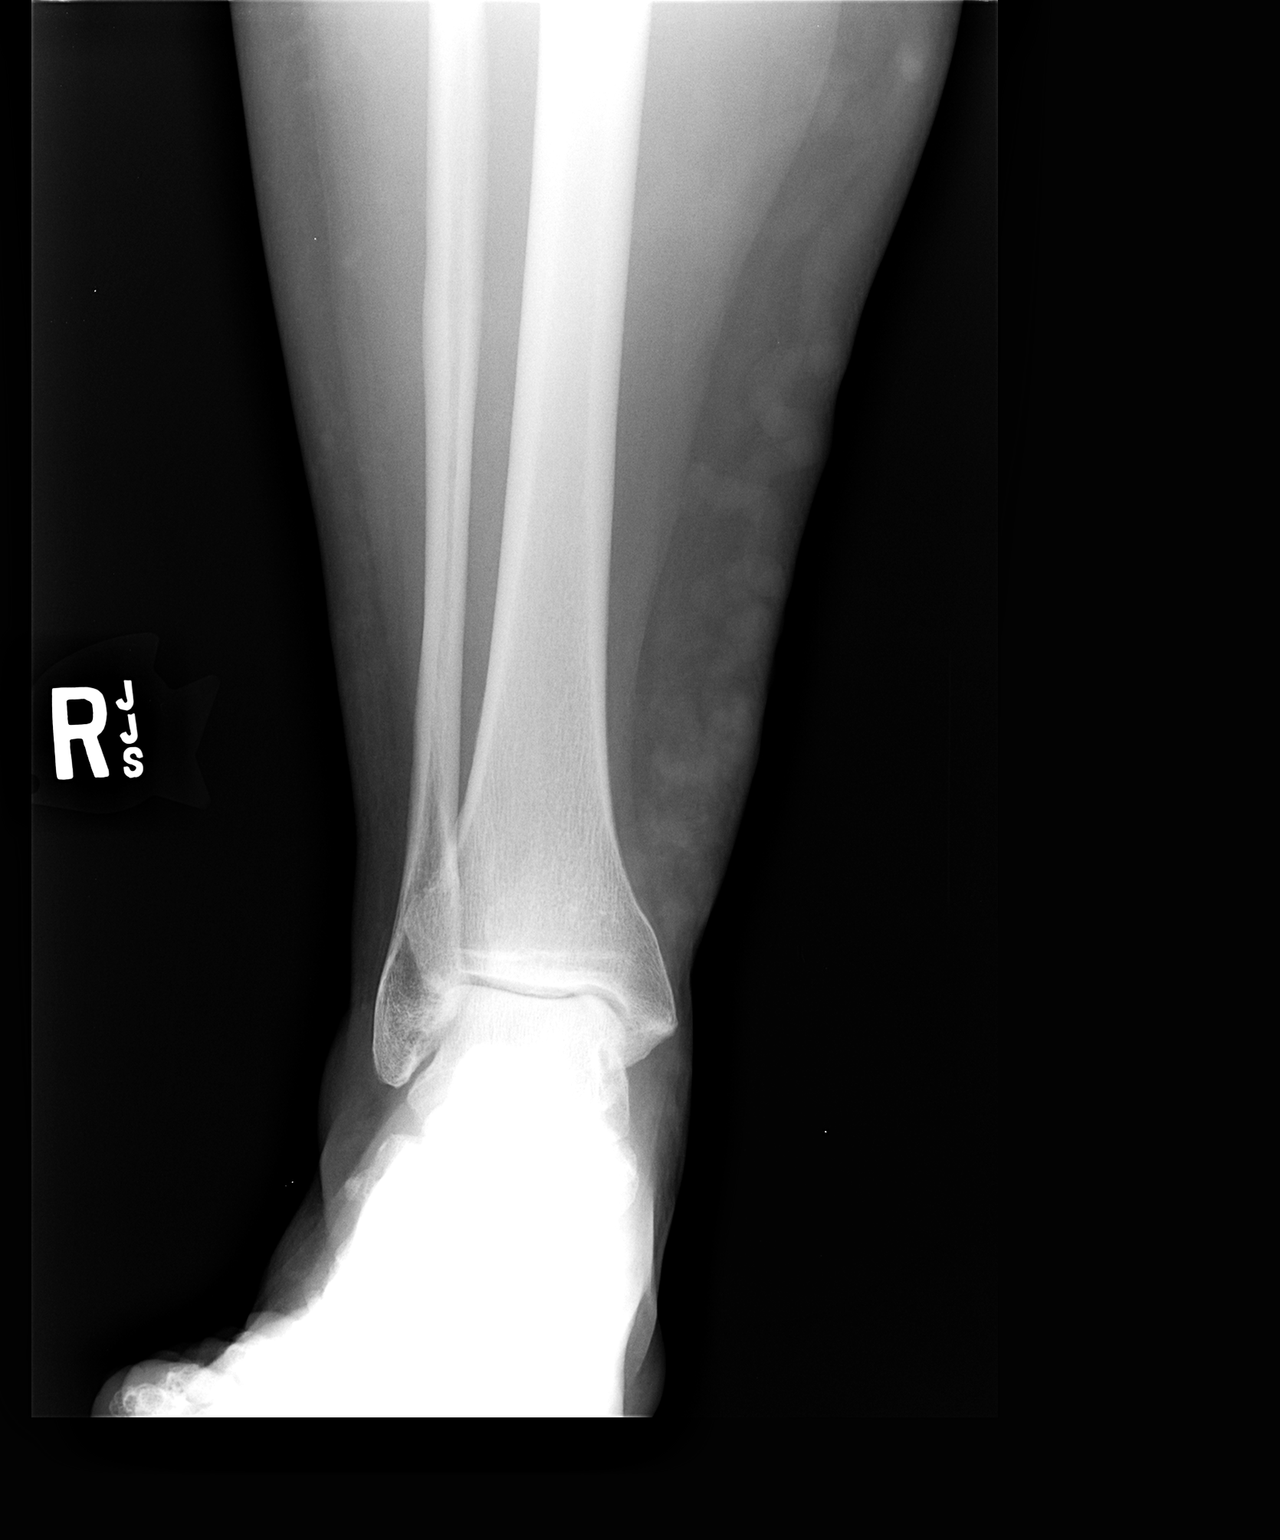

[view not recorded (2 of 3)]
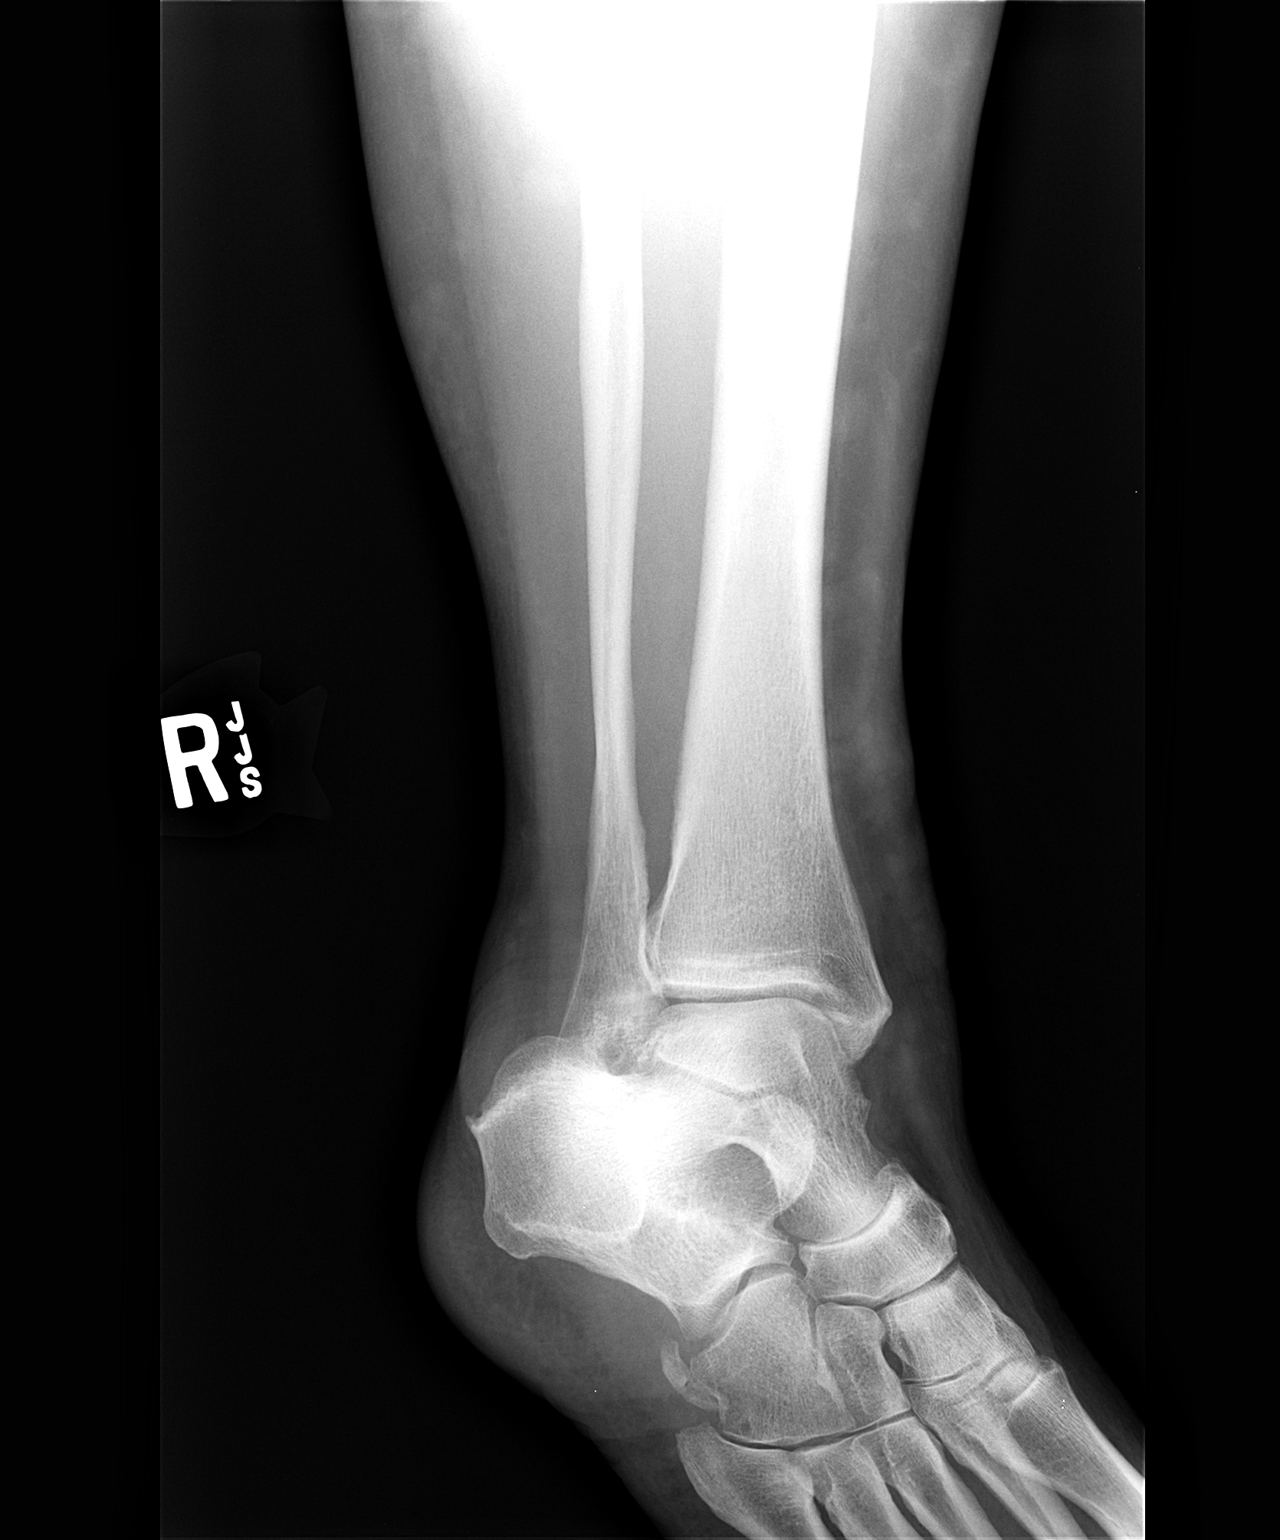

[view not recorded (3 of 3)]
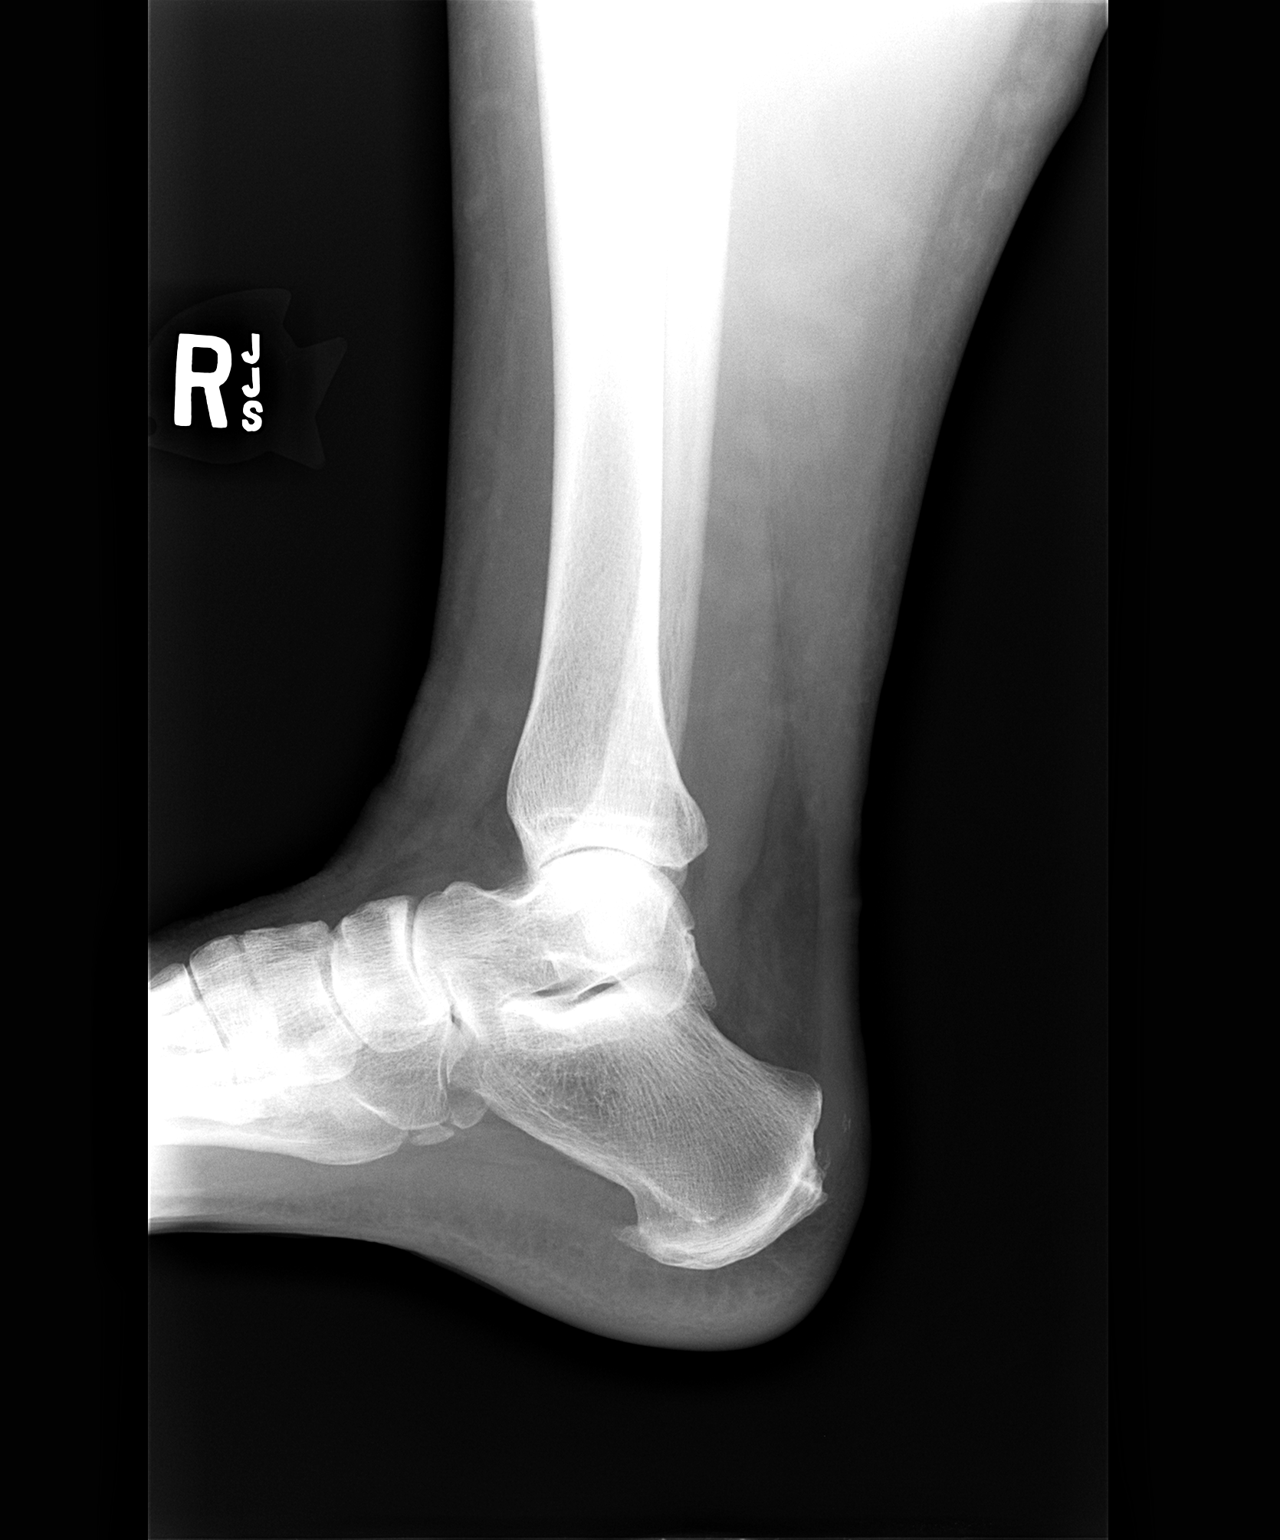

[3 of 3 positions shown; findings below may reference images not displayed]

FINDINGS: No acute fracture or dislocation.  Small calcaneal and
Achilles spurs identified.  Mild tibiotalar osteoarthritis.
IMPRESSION: Small calcaneal and Achilles spurs. No acute findings.

## 2012-12-23 ENCOUNTER — Other Ambulatory Visit: Payer: Self-pay | Admitting: Internal Medicine

## 2013-01-24 ENCOUNTER — Other Ambulatory Visit: Payer: Self-pay | Admitting: Internal Medicine

## 2013-01-24 ENCOUNTER — Other Ambulatory Visit: Payer: Self-pay | Admitting: *Deleted

## 2013-01-24 MED ORDER — FLUOXETINE HCL 20 MG PO CAPS: 20.0000 mg | ORAL_CAPSULE | Freq: Every day | ORAL | Status: DC

## 2013-02-14 IMAGING — CR DG SHOULDER 2+V*L*
3 series · 3 of 3 positions shown · non-contrast
Comparison: None.

CLINICAL DATA: Left shoulder pain, no injury

LEFT SHOULDER - 2+ VIEW

[view not recorded (1 of 3)]
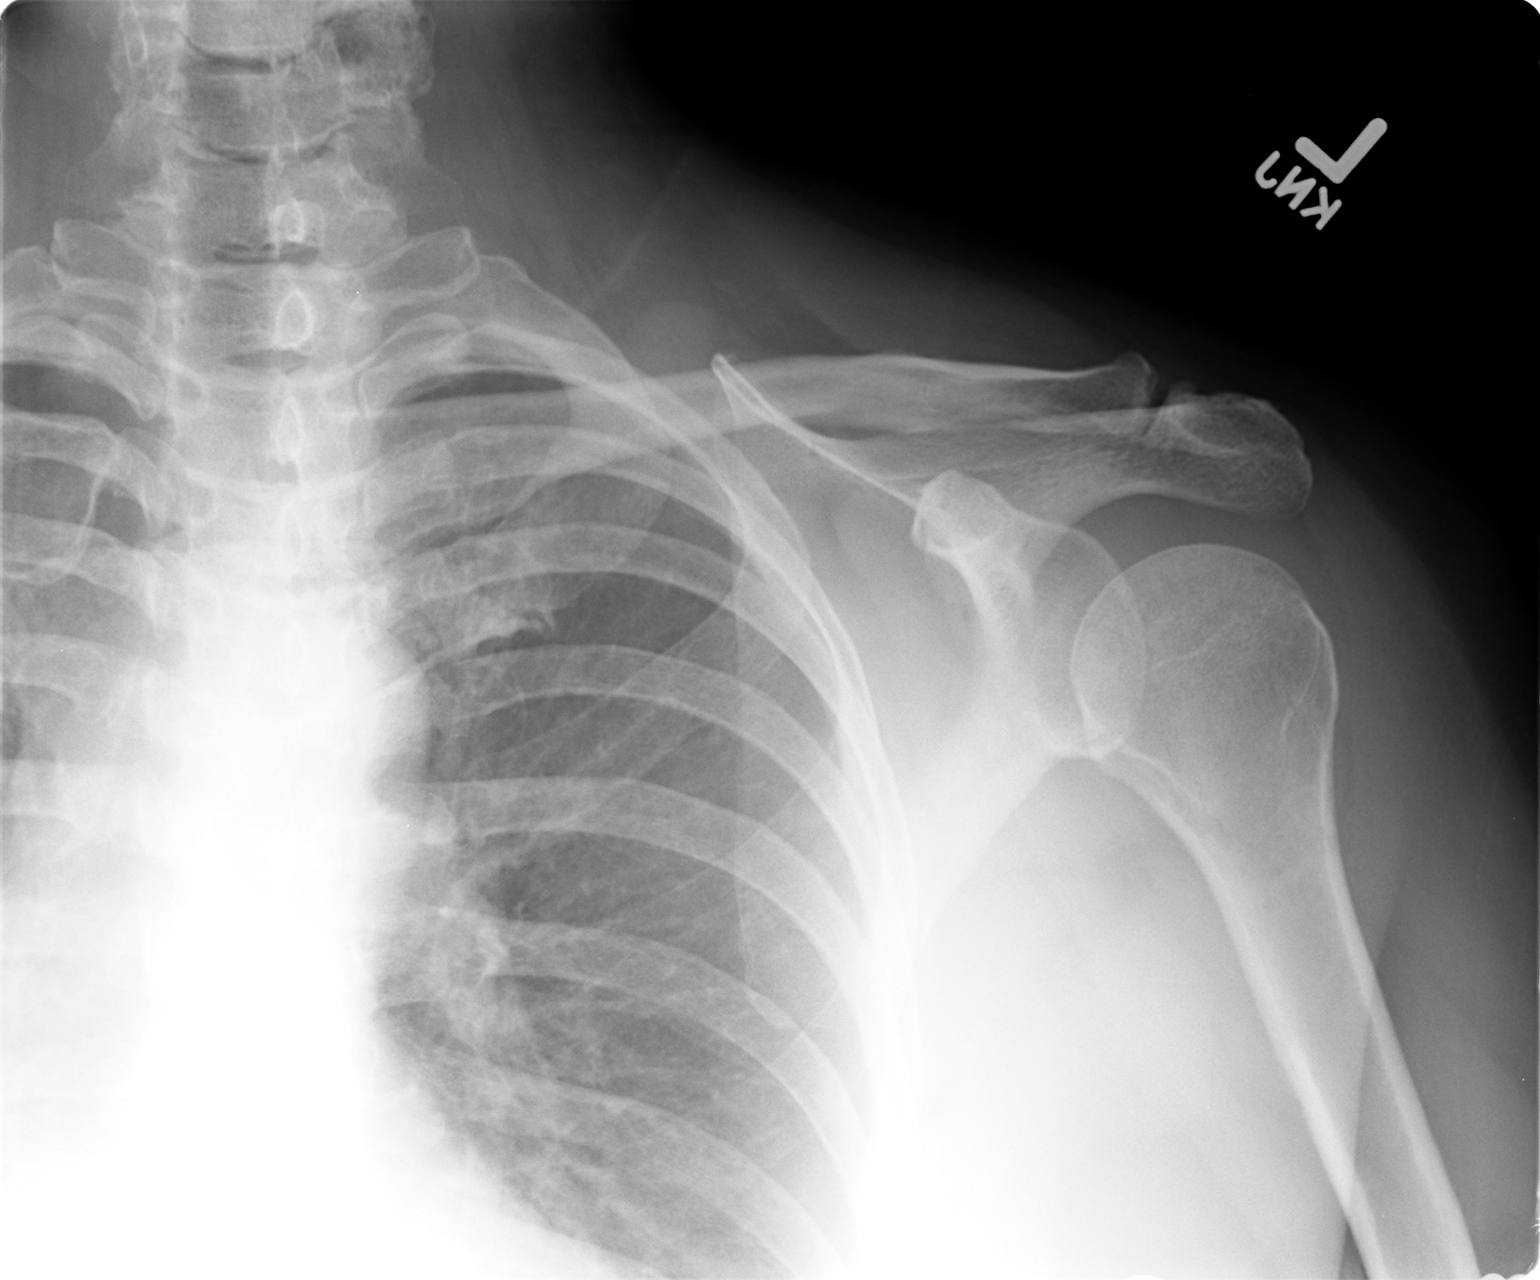

[view not recorded (2 of 3)]
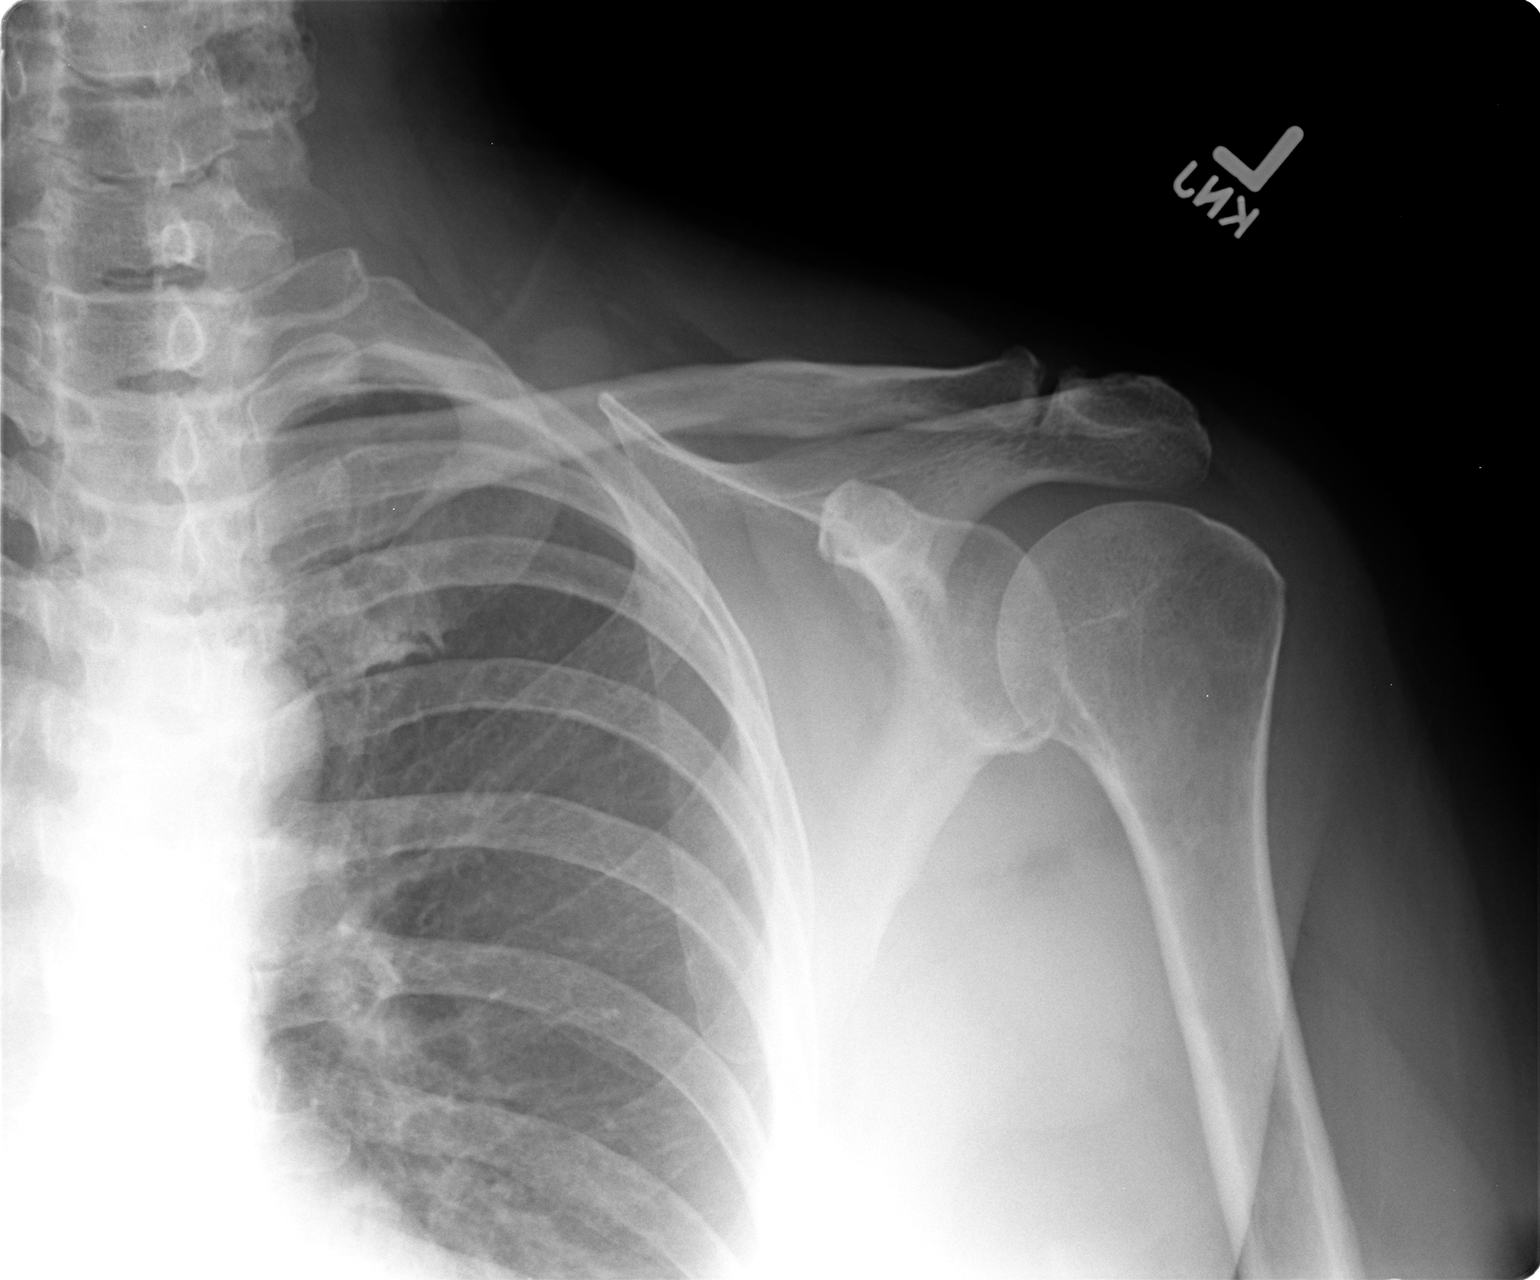

[view not recorded (3 of 3)]
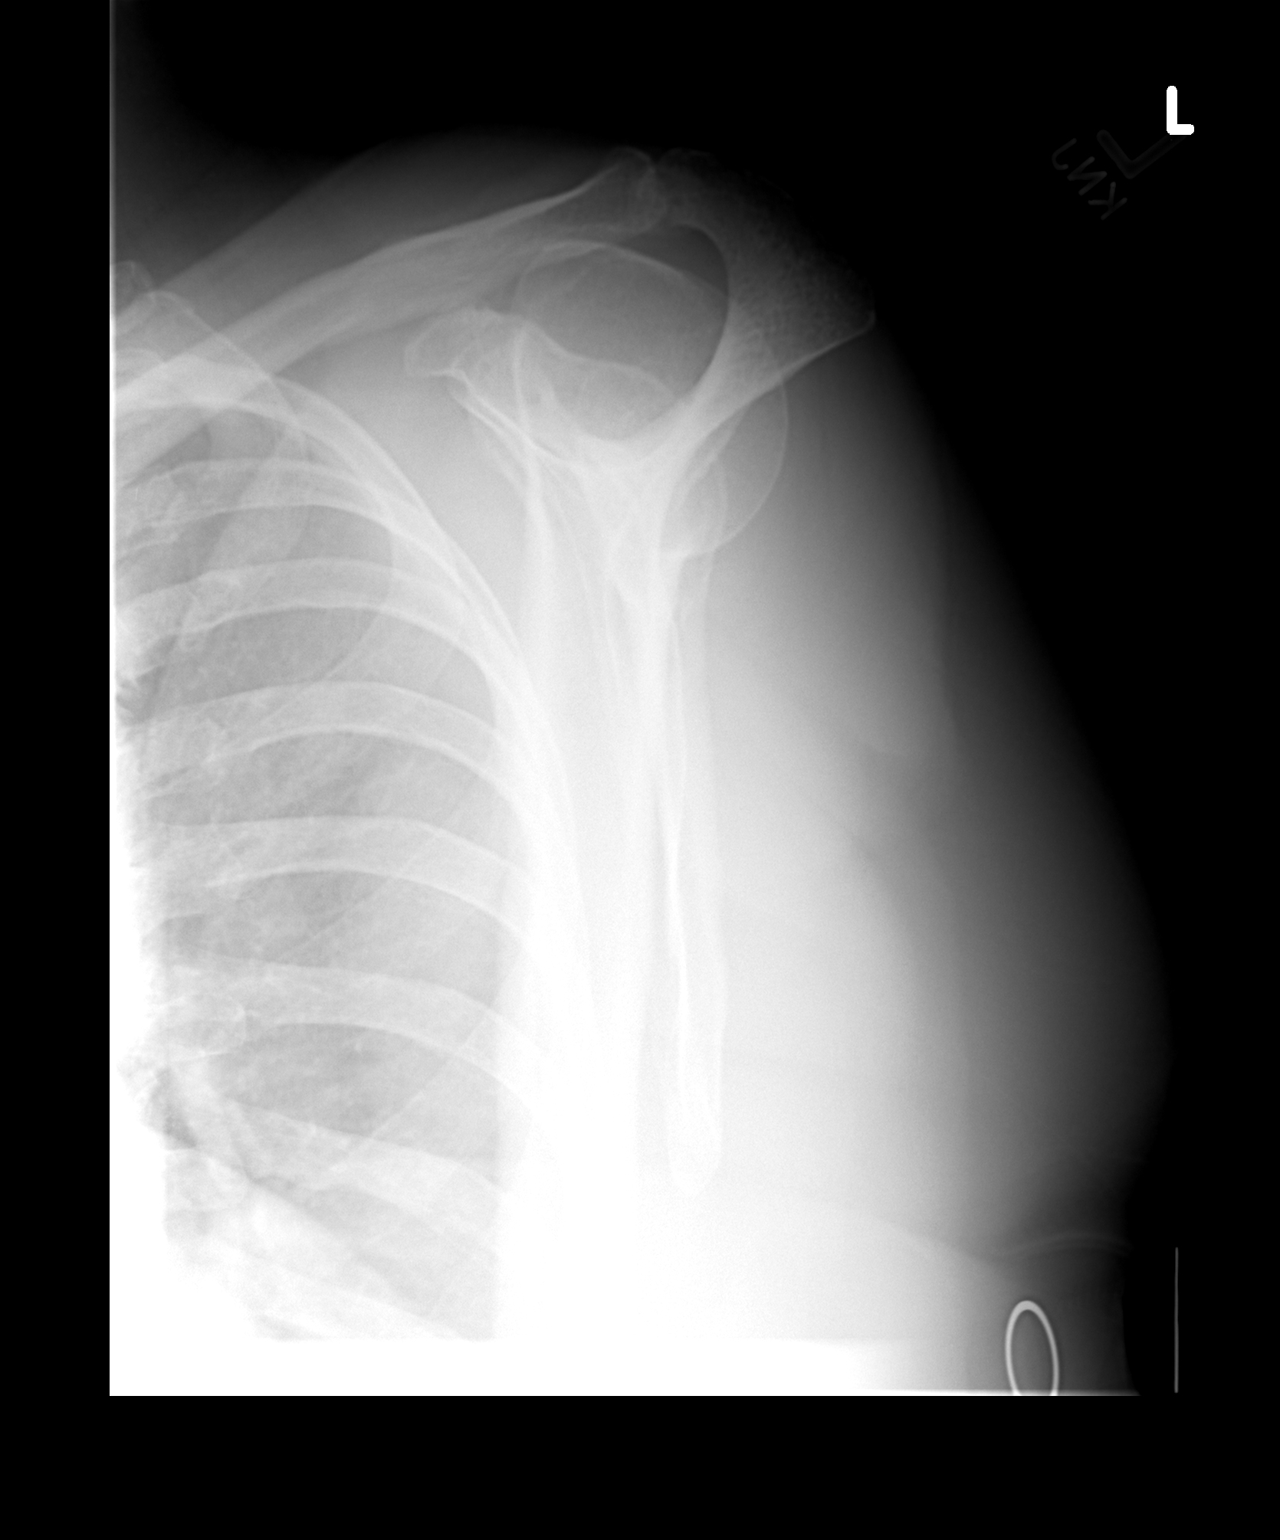

[3 of 3 positions shown; findings below may reference images not displayed]

FINDINGS: The left humeral head is in good position within the left
glenohumeral joint.  There is mild degenerative change at the left
AC joint which is normally aligned.  No acute fracture is seen.
IMPRESSION: Mild degenerative change at the left AC joint.  No other
abnormality.

## 2013-05-02 ENCOUNTER — Other Ambulatory Visit: Payer: Self-pay | Admitting: *Deleted

## 2013-05-02 MED ORDER — ALPRAZOLAM 0.5 MG PO TABS: ORAL_TABLET | ORAL | Status: DC

## 2013-07-23 ENCOUNTER — Other Ambulatory Visit: Payer: Self-pay | Admitting: *Deleted

## 2013-07-23 MED ORDER — FLUOXETINE HCL 20 MG PO CAPS: 20.0000 mg | ORAL_CAPSULE | Freq: Every day | ORAL | Status: DC

## 2013-07-26 ENCOUNTER — Other Ambulatory Visit: Payer: Self-pay | Admitting: *Deleted

## 2013-07-26 MED ORDER — ALPRAZOLAM 0.5 MG PO TABS: ORAL_TABLET | ORAL | Status: DC

## 2013-08-24 ENCOUNTER — Encounter: Payer: Self-pay | Admitting: Internal Medicine

## 2013-08-24 ENCOUNTER — Ambulatory Visit (INDEPENDENT_AMBULATORY_CARE_PROVIDER_SITE_OTHER): Payer: BC Managed Care – PPO | Admitting: Internal Medicine

## 2013-08-24 VITALS — BP 112/80 | HR 64 | Temp 98.2°F | Ht 64.0 in | Wt 232.0 lb

## 2013-08-24 DIAGNOSIS — K219 Gastro-esophageal reflux disease without esophagitis: Secondary | ICD-10-CM

## 2013-08-24 DIAGNOSIS — Z0000 Encounter for general adult medical examination without abnormal findings: Secondary | ICD-10-CM

## 2013-08-24 DIAGNOSIS — I8390 Asymptomatic varicose veins of unspecified lower extremity: Secondary | ICD-10-CM

## 2013-08-24 DIAGNOSIS — E785 Hyperlipidemia, unspecified: Secondary | ICD-10-CM

## 2013-08-24 DIAGNOSIS — E669 Obesity, unspecified: Secondary | ICD-10-CM

## 2013-08-24 DIAGNOSIS — Z23 Encounter for immunization: Secondary | ICD-10-CM

## 2013-08-24 LAB — HEPATIC FUNCTION PANEL
Bilirubin, Direct: 0 mg/dL (ref 0.0–0.3)
Total Bilirubin: 0.5 mg/dL (ref 0.3–1.2)
Total Protein: 6.8 g/dL (ref 6.0–8.3)

## 2013-08-24 LAB — BASIC METABOLIC PANEL
BUN: 22 mg/dL (ref 6–23)
CO2: 27 mEq/L (ref 19–32)
Chloride: 109 mEq/L (ref 96–112)
Creatinine, Ser: 0.7 mg/dL (ref 0.4–1.2)

## 2013-08-24 LAB — LIPID PANEL
HDL: 41.1 mg/dL (ref >39.00)
LDL Cholesterol: 107 mg/dL — ABNORMAL HIGH (ref 0–99)
Total CHOL/HDL Ratio: 5
Triglycerides: 190 mg/dL — ABNORMAL HIGH (ref 0.0–149.0)

## 2013-08-24 LAB — CBC WITH DIFFERENTIAL/PLATELET
Eosinophils Absolute: 0.2 10*3/uL (ref 0.0–0.7)
MCHC: 33.8 g/dL (ref 30.0–36.0)
MCV: 90.8 fl (ref 78.0–100.0)
Monocytes Absolute: 0.5 10*3/uL (ref 0.1–1.0)
Neutrophils Relative %: 56.5 % (ref 43.0–77.0)
Platelets: 176 10*3/uL (ref 150.0–400.0)
RDW: 13.5 % (ref 11.5–14.6)

## 2013-08-24 MED ORDER — ALPRAZOLAM 0.5 MG PO TABS: ORAL_TABLET | ORAL | Status: DC

## 2013-08-24 NOTE — Assessment & Plan Note (Signed)
Well controlled 

## 2013-08-24 NOTE — Progress Notes (Signed)
Pt with mod disorder doing well on prozac  GERD- no sxs on meds  Lipids- currently not treated Lipid Panel     Component Value Date/Time   CHOL 194 07/21/2012 0943   TRIG 139.0 07/21/2012 0943   HDL 48.30 07/21/2012 0943   CHOLHDL 4 07/21/2012 0943   VLDL 27.8 07/21/2012 0943   LDLCALC 118* 07/21/2012 0943    eviewed pmh, psh, sochx, meds  Reviewed vitals  Well-developed well-nourished female in no acute distress. HEENT exam atraumatic, normocephalic, extraocular muscles are intact. Neck is supple. No jugular venous distention no thyromegaly. Chest clear to auscultation without increased work of breathing. Cardiac exam S1 and S2 are regular. Abdominal exam active bowel sounds, soft, nontender. Extremities no edema. Neurologic exam she is alert without any motor sensory deficits. Gait is normal.  A/p- preventive- schedule colonoscopy Flu immunization today

## 2013-08-24 NOTE — Assessment & Plan Note (Signed)
Discussed need for weight loss Daily exercise and low calorie diet

## 2013-08-24 NOTE — Assessment & Plan Note (Signed)
Compression stockings.  

## 2013-08-29 NOTE — Progress Notes (Signed)
Quick Note:  Called and spoke with pt and pt is aware. ______ 

## 2013-10-10 ENCOUNTER — Other Ambulatory Visit: Payer: Self-pay | Admitting: Internal Medicine

## 2014-02-20 ENCOUNTER — Other Ambulatory Visit: Payer: Self-pay | Admitting: Internal Medicine

## 2014-04-01 ENCOUNTER — Telehealth: Payer: Self-pay | Admitting: Internal Medicine

## 2014-04-01 NOTE — Telephone Encounter (Signed)
Pt is needing new rx for ALPRAZolam (XANAX) 0.5 MG tablet, [facility] to [facility] [address] [address] [place].

## 2014-04-02 NOTE — Telephone Encounter (Signed)
Pt is out of med

## 2014-04-02 NOTE — Telephone Encounter (Signed)
Med called into pharmacy and refilled

## 2014-04-08 ENCOUNTER — Encounter: Payer: BC Managed Care – PPO | Admitting: Family Medicine

## 2014-04-08 NOTE — Progress Notes (Signed)
Error No-show This encounter was created in error - please disregard. 

## 2014-04-10 ENCOUNTER — Encounter: Payer: Self-pay | Admitting: Family Medicine

## 2014-04-10 ENCOUNTER — Telehealth: Payer: Self-pay

## 2014-04-10 ENCOUNTER — Ambulatory Visit (INDEPENDENT_AMBULATORY_CARE_PROVIDER_SITE_OTHER): Payer: BC Managed Care – PPO | Admitting: Family Medicine

## 2014-04-10 VITALS — BP 108/78 | Temp 98.3°F | Wt 235.0 lb

## 2014-04-10 DIAGNOSIS — F329 Major depressive disorder, single episode, unspecified: Secondary | ICD-10-CM

## 2014-04-10 DIAGNOSIS — F3289 Other specified depressive episodes: Secondary | ICD-10-CM

## 2014-04-10 DIAGNOSIS — F411 Generalized anxiety disorder: Secondary | ICD-10-CM

## 2014-04-10 MED ORDER — LORAZEPAM 0.5 MG PO TABS: 0.5000 mg | ORAL_TABLET | Freq: Three times a day (TID) | ORAL | Status: DC | PRN

## 2014-04-10 NOTE — Progress Notes (Signed)
Chief Complaint  Patient presents with  . Medication Refill    HPI:  Medication check: PCP not available  Depression and Anxiety: -needed refills but ended up getting these - she is very upset that she has heard her PCP is leaving and she was given no information about how to move forward regarding this - asking for names, a list of providers taking new patients, etc -doing well but she want to wean off ot he benzo - her daughter's doctor stopped this medication suddenly and was terrible and the same happened with her mother and her mother ended up in the hospital so this medication scares her   GERD: -dong well  ROS: See pertinent positives and negatives per HPI.  Past Medical History  Diagnosis Date  . Depression   . Suicidal thoughts   . Stress   . Insomnia   . Mood disorder     Past Surgical History  Procedure Laterality Date  . Abdominal hysterectomy      Family History  Problem Relation Age of Onset  . Cancer Father     lung and prostate  . Kidney disease Father     stone  . Emphysema Father   . Aneurysm Father     abd  . Irritable bowel syndrome Father   . Diabetes Father   . Heart attack Father   . Hyperlipidemia Father   . Hypertension Father   . Lupus Mother   . Arthritis Mother   . Crohn's disease Mother   . Heart attack Mother   . Hyperlipidemia Mother   . Hypertension Mother   . Dementia Mother   . Arthritis Sister   . Arthritis Brother   . Irritable bowel syndrome Paternal Uncle   . Diabetes Maternal Grandmother   . Heart attack Maternal Grandmother   . Diabetes Paternal Grandmother   . Irritable bowel syndrome Paternal Grandmother   . Diabetes Paternal Grandfather   . Diabetes Maternal Grandfather     History   Social History  . Marital Status: Married    Spouse Name: N/A    Number of Children: N/A  . Years of Education: N/A   Social History Main Topics  . Smoking status: Never Smoker   . Smokeless tobacco: Never Used  .  Alcohol Use: No  . Drug Use: None  . Sexual Activity: None   Other Topics Concern  . None   Social History Narrative  . None    Current outpatient prescriptions:ALPRAZolam (XANAX) 0.5 MG tablet, TAKE 1 TABLET(S) BY MOUTH EVERY NIGHT AT BEDTIME AS DIRECTED, Disp: 30 tablet, Rfl: 4;  FLUoxetine (PROZAC) 20 MG capsule, TAKE 1 CAPSULE (20 MG TOTAL) BY MOUTH DAILY. NEEDS OV, Disp: 90 capsule, Rfl: 1;  omeprazole (PRILOSEC) 20 MG capsule, Take 20 mg by mouth daily.  , Disp: , Rfl:  [START ON 06/26/2014] LORazepam (ATIVAN) 0.5 MG tablet, Take 1 tablet (0.5 mg total) by mouth every 8 (eight) hours as needed for anxiety., Disp: 60 tablet, Rfl: 0  EXAM:  Filed Vitals:   04/10/14 1012  BP: 108/78  Temp: 98.3 F (36.8 C)    Body mass index is 40.32 kg/(m^2).  GENERAL: vitals reviewed and listed above, alert, oriented, appears well hydrated and in no acute distress  HEENT: atraumatic, conjunttiva clear, no obvious abnormalities on inspection of external nose and ears  NECK: no obvious masses on inspection  LUNGS: clear to auscultation bilaterally, no wheezes, rales or rhonchi, good air movement  CV: HRRR, no  peripheral edema  MS: moves all extremities without noticeable abnormality  PSYCH: pleasant and cooperative, no obvious depression or anxiety  ASSESSMENT AND PLAN:  Discussed the following assessment and plan:  Anxiety state, unspecified - Plan: LORazepam (ATIVAN) 0.5 MG tablet  DEPRESSION  -discussed options for management of depression/enxiety/insomnia -discussed risks and benefits of benzos -she opted for transition to ativan and to attempt to wean, given I do not prescribe benzos longterm - if wean unsuccessful she agreed to see psychiatrist and numbers provided - however she really wants to come off of this medication  -she requested to establish care with me and was advised to schedule a transfer patient visit so that we can go over her health history, etc -Patient  advised to return or notify a doctor immediately if symptoms worsen or persist or new concerns arise.  Patient Instructions  Xanax/Ativan Wean: -stop the xanax (do not take xanax and ativan at the same time) -start 0.5mg  of ativan daily at night  for 3 weeks (you may notice some changes) -then go to 1/2 tab nightly for 3 weeks -then every other night for 3 weeks -then 2 nights per week as needed for 3 weeks -then 1 night per week as needed -if this medication is needed long term I advise a psychiatrist - you can call to schedule this  Follow up with me for your new patient visit in the next 3-6 months      Terressa KoyanagiHannah R. Bernie Fobes

## 2014-04-10 NOTE — Telephone Encounter (Signed)
Called [facility] and spoke with Molli HazardMatthew and was told that on 02/20/14 the prescription was not received.  [facility]  Pt states she did receive that rx but her concern was that she called the office with only 2.5 days of medication left and she was out of medicine from Thursday until Monday.  Advised pt of medication policy in the office.

## 2014-04-10 NOTE — Patient Instructions (Addendum)
Xanax/Ativan Wean: -stop the xanax (do not take xanax and ativan at the same time) -start 0.5mg  of ativan daily at night  for 3 weeks (you may notice some changes) -then go to 1/2 tab nightly for 3 weeks -then every other night for 3 weeks -then 2 nights per week as needed for 3 weeks -then 1 night per week as needed -if this medication is needed long term I advise a psychiatrist - you can call to schedule this  Follow up with me for your new patient visit in the next 3-6 months

## 2014-04-10 NOTE — Progress Notes (Signed)
Pre visit review using our clinic review tool, if applicable. No additional management support is needed unless otherwise documented below in the visit note. 

## 2014-04-19 ENCOUNTER — Telehealth: Payer: Self-pay | Admitting: Internal Medicine

## 2014-04-19 ENCOUNTER — Encounter: Payer: Self-pay | Admitting: Internal Medicine

## 2014-04-19 ENCOUNTER — Other Ambulatory Visit: Payer: BC Managed Care – PPO

## 2014-04-19 MED ORDER — FLUOXETINE HCL 20 MG PO CAPS: ORAL_CAPSULE | ORAL | Status: DC

## 2014-04-19 NOTE — Telephone Encounter (Signed)
Patient only has 6 FLUoxetine (PROZAC) 20 MG capsules left. She called the prescription in on Monday and it hasn't been filled yet. She is requesting a refill on medication. Thanks!-

## 2014-04-26 ENCOUNTER — Encounter: Payer: Self-pay | Admitting: Family Medicine

## 2014-04-26 ENCOUNTER — Ambulatory Visit (INDEPENDENT_AMBULATORY_CARE_PROVIDER_SITE_OTHER): Payer: BC Managed Care – PPO | Admitting: Family Medicine

## 2014-04-26 VITALS — BP 122/80 | HR 92 | Temp 97.8°F | Ht 63.5 in | Wt 235.5 lb

## 2014-04-26 DIAGNOSIS — F419 Anxiety disorder, unspecified: Principal | ICD-10-CM

## 2014-04-26 DIAGNOSIS — E785 Hyperlipidemia, unspecified: Secondary | ICD-10-CM

## 2014-04-26 DIAGNOSIS — F329 Major depressive disorder, single episode, unspecified: Secondary | ICD-10-CM

## 2014-04-26 DIAGNOSIS — K219 Gastro-esophageal reflux disease without esophagitis: Secondary | ICD-10-CM

## 2014-04-26 DIAGNOSIS — F341 Dysthymic disorder: Secondary | ICD-10-CM

## 2014-04-26 DIAGNOSIS — F411 Generalized anxiety disorder: Secondary | ICD-10-CM

## 2014-04-26 NOTE — Progress Notes (Signed)
Pre visit review using our clinic review tool, if applicable. No additional management support is needed unless otherwise documented below in the visit note. 

## 2014-04-26 NOTE — Patient Instructions (Signed)
Xanax/Ativan Wean:  -stop the xanax today (do not take xanax and ativan at the same time)  -start 0.5mg  of ativan daily at night for 3 weeks (you may notice some changes)  -then go to 1/2 tab nightly for 3 weeks  -then every other night for 3 weeks  -then 2 nights per week as needed for 3 weeks  -then 1 night per week as needed  -if this medication is needed long term I advise a psychiatrist - you can call to schedule this  [place]

## 2014-04-26 NOTE — Progress Notes (Signed)
No chief complaint on file.   HPI:  Here for CPE but already had CPE and reports doesn't know why the front desk made her schedule a physical. Also reports she was told to come for labs last week and then told no labs needed.  Concerns/follow up today:   1-2) Anxiety and Depression -seen 04/10/14 and opted to wean of benzo as she was very nervous about this medication due to adverse effects in family member, wants to use ativan instead of xanax for the wean but doesn't currently have -on prozac 20 mg daily -has been quite upset that prior PCP was leaving and that refills are not fill immediately per review of phone notes, she has been made aware of refill request turn around time per notes -reports: feels great on wean, but does not have weaning instructions   3)GERD: -stable on omeprazole  4)Hx Hyperlipidemia: -not on medication - labs good last year   Past Medical History  Diagnosis Date  . Depression   . Suicidal thoughts   . Stress   . Insomnia   . Mood disorder     Past Surgical History  Procedure Laterality Date  . Abdominal hysterectomy      Family History  Problem Relation Age of Onset  . Cancer Father     lung and prostate  . Kidney disease Father     stone  . Emphysema Father   . Aneurysm Father     abd  . Irritable bowel syndrome Father   . Diabetes Father   . Heart attack Father   . Hyperlipidemia Father   . Hypertension Father   . Lupus Mother   . Arthritis Mother   . Crohn's disease Mother   . Heart attack Mother   . Hyperlipidemia Mother   . Hypertension Mother   . Dementia Mother   . Arthritis Sister   . Arthritis Brother   . Irritable bowel syndrome Paternal Uncle   . Diabetes Maternal Grandmother   . Heart attack Maternal Grandmother   . Diabetes Paternal Grandmother   . Irritable bowel syndrome Paternal Grandmother   . Diabetes Paternal Grandfather   . Diabetes Maternal Grandfather     History   Social History  . Marital  Status: Married    Spouse Name: N/A    Number of Children: N/A  . Years of Education: N/A   Social History Main Topics  . Smoking status: Never Smoker   . Smokeless tobacco: Never Used  . Alcohol Use: No  . Drug Use: None  . Sexual Activity: None   Other Topics Concern  . None   Social History Narrative  . None    Current outpatient prescriptions:FLUoxetine (PROZAC) 20 MG capsule, TAKE 1 CAPSULE (20 MG TOTAL) BY MOUTH DAILY. NEEDS OV, Disp: 90 capsule, Rfl: 1;  omeprazole (PRILOSEC) 20 MG capsule, Take 20 mg by mouth daily.  , Disp: , Rfl:   EXAM:  Filed Vitals:   04/26/14 1435  BP: 122/80  Pulse: 92  Temp: 97.8 F (36.6 C)    GENERAL: vitals reviewed and listed below, alert, oriented, appears well hydrated and in no acute distress  HEENT: head atraumatic, PERRLA, normal appearance of eyes, ears, nose and mouth. moist mucus membranes.  NECK: supple, no masses or lymphadenopathy  PSYCH: normal affect, pleasant and cooperative  ASSESSMENT AND PLAN:  Discussed the following assessment and plan:  Anxiety and depression  GERD (gastroesophageal reflux disease)  Hyperlipidemia  Anxiety state, unspecified  Updated and  reprinted benzo wean Apologized for physical exam scheduling issues and offered labs but she prefers to wait until yearly physical - she was very gracious about this [place] Follow up as needed in interim  Patient Instructions  Xanax/Ativan Wean:  -stop the xanax today (do not take xanax and ativan at the same time)  -start 0.5mg  of ativan daily at night for 3 weeks (you may notice some changes)  -then go to 1/2 tab nightly for 3 weeks  -then every other night for 3 weeks  -then 2 nights per week as needed for 3 weeks  -then 1 night per week as needed  -if this medication is needed long term I advise a psychiatrist - you can call to schedule this  [place]abl[place] doctor in [place] for you  physical in August    Patient advised to return to clinic immediately if symptoms worsen or persist or new concerns.    No Follow-up on file.  Shirley Allen

## 2014-07-23 ENCOUNTER — Encounter: Payer: Self-pay | Admitting: Internal Medicine

## 2014-08-29 ENCOUNTER — Other Ambulatory Visit: Payer: Self-pay | Admitting: Internal Medicine
